# Patient Record
Sex: Female | Born: 2000 | Race: White | Hispanic: No | Marital: Single | Smoking: Former smoker
Health system: Southern US, Community
[De-identification: ages and names within clinical notes are randomized; demographics above are authoritative.]

## PROBLEM LIST (undated history)

## (undated) DIAGNOSIS — F32A Depression, unspecified: Secondary | ICD-10-CM

## (undated) DIAGNOSIS — F419 Anxiety disorder, unspecified: Secondary | ICD-10-CM

## (undated) DIAGNOSIS — N739 Female pelvic inflammatory disease, unspecified: Secondary | ICD-10-CM

## (undated) DIAGNOSIS — Z789 Other specified health status: Secondary | ICD-10-CM

## (undated) DIAGNOSIS — N390 Urinary tract infection, site not specified: Secondary | ICD-10-CM

## (undated) HISTORY — DX: Other specified health status: Z78.9

## (undated) HISTORY — PX: NO PAST SURGERIES: SHX2092

---
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
## 2019-02-28 IMAGING — US US PELVIS COMPLETE TRANSABD/TRANSVAG
1 series · 14 of 25 positions shown · non-contrast
Comparison: None

CLINICAL DATA: Pelvic pain and metrorrhagia for the past day.

EXAM:
TRANSABDOMINAL AND TRANSVAGINAL ULTRASOUND OF PELVIS
TECHNIQUE: Both transabdominal and transvaginal ultrasound examinations of the
pelvis were performed. Transabdominal technique was performed for
global imaging of the pelvis including uterus, ovaries, adnexal
regions, and pelvic cul-de-sac. It was necessary to proceed with
endovaginal exam following the transabdominal exam to visualize the
uterus, endometrium and ovaries in better detail.

[Series 1: us pelvis complete transabd/transvag · 0.21mm/px · 14 of 70 slices shown]
[im 1/70]
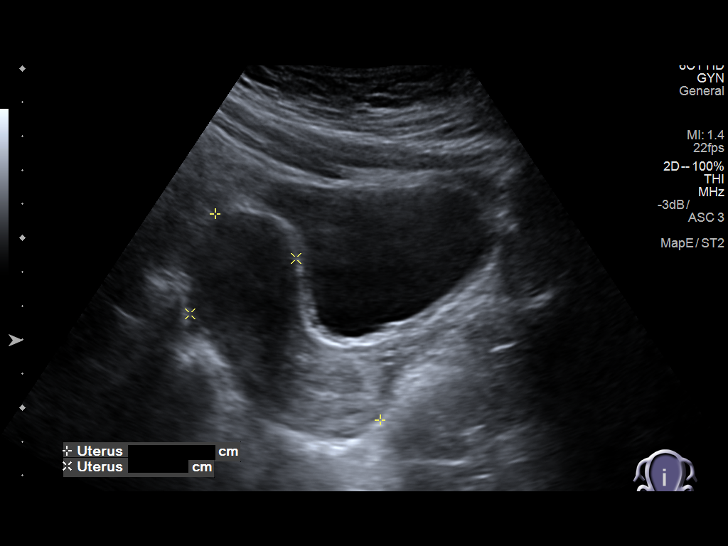
[im 6/70]
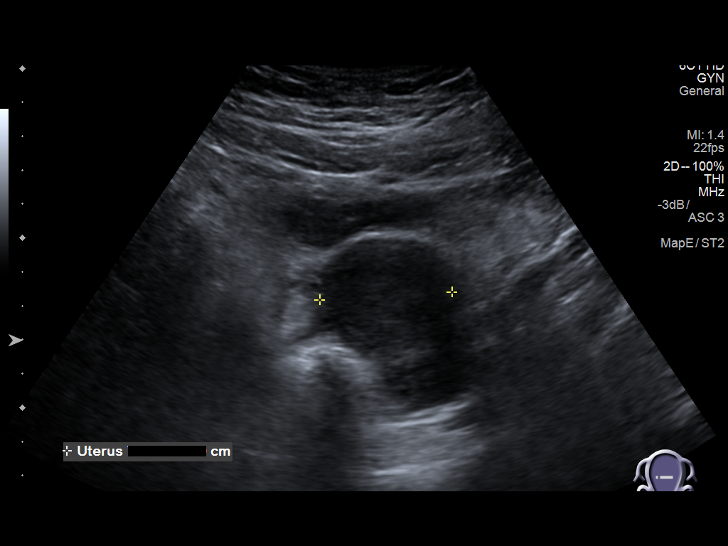
[im 12/70]
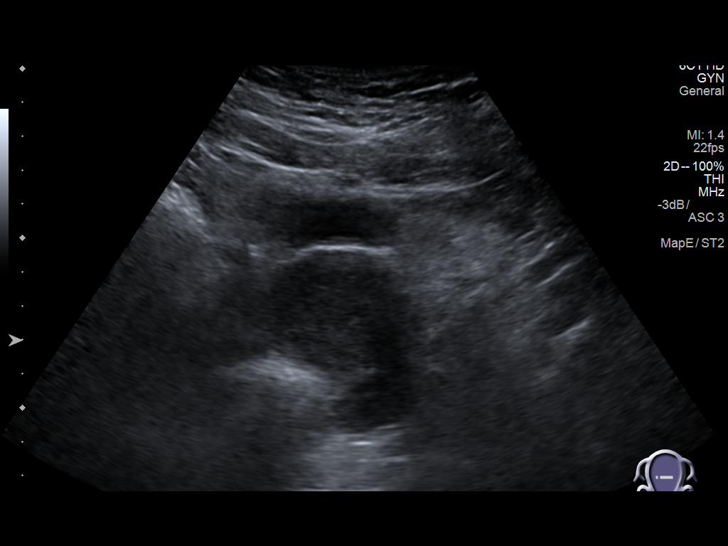
[im 18/70]
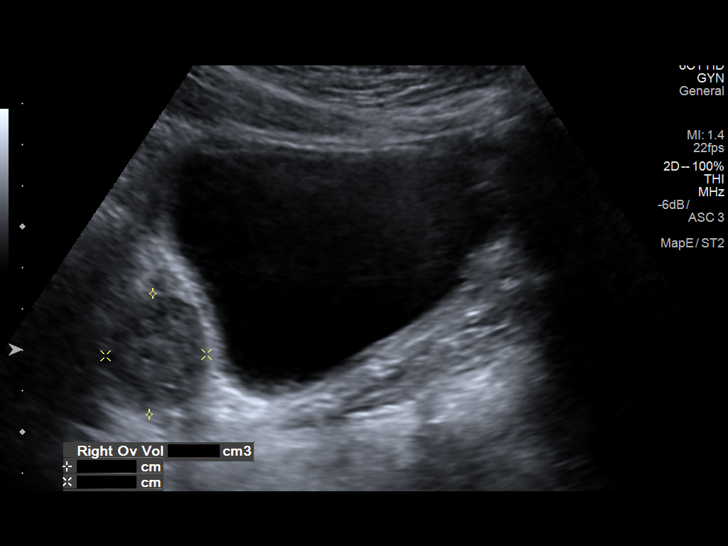
[im 24/70]
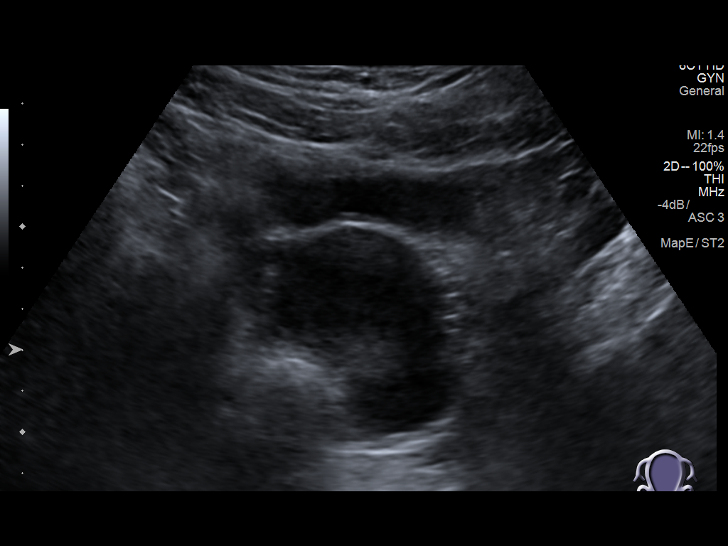
[im 26/70]
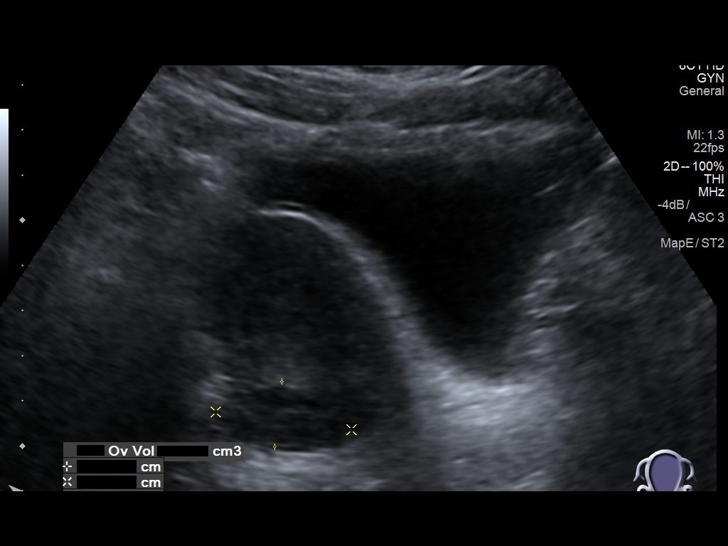
[im 32/70]
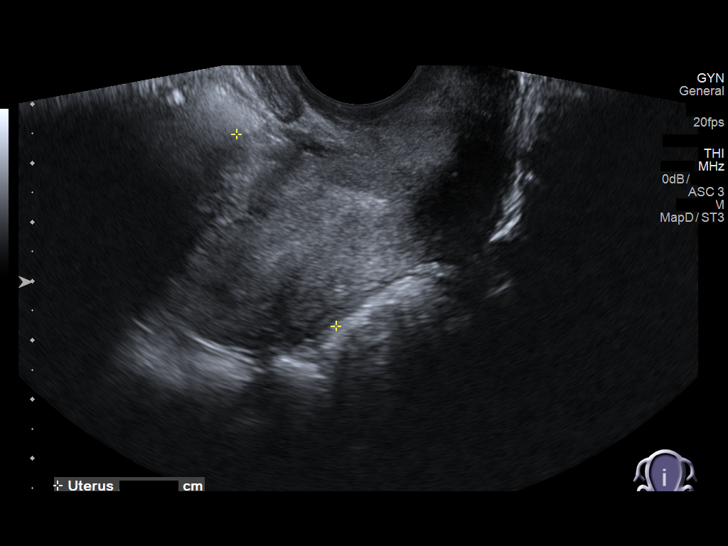
[im 38/70]
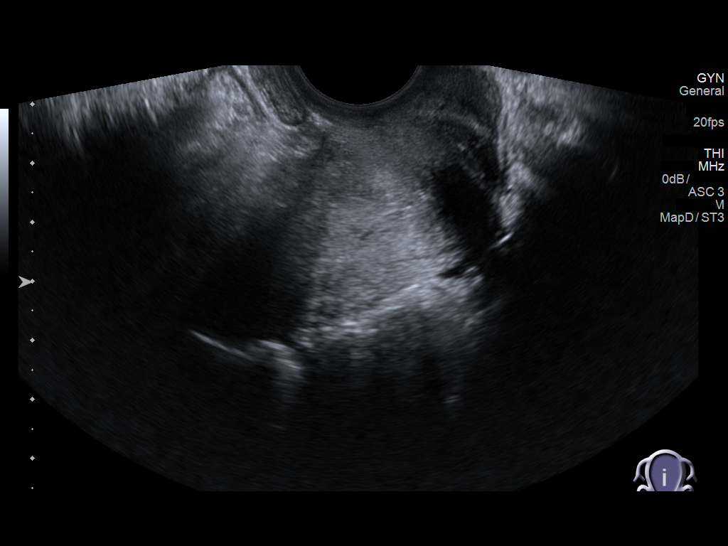
[im 44/70]
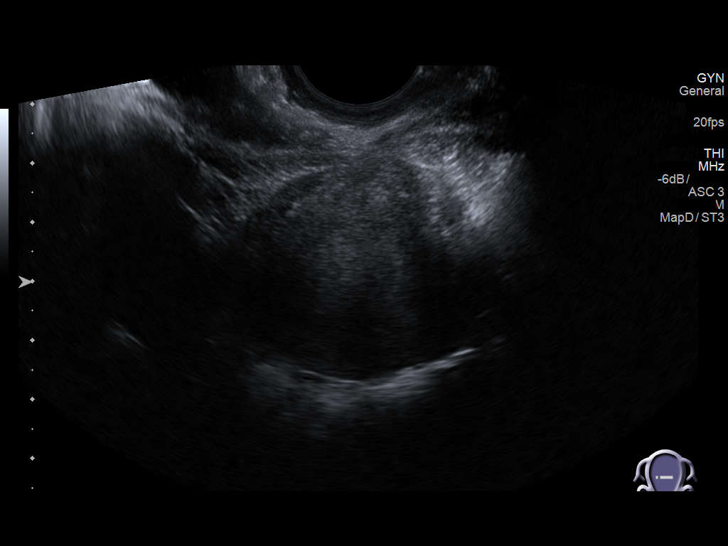
[im 47/70]
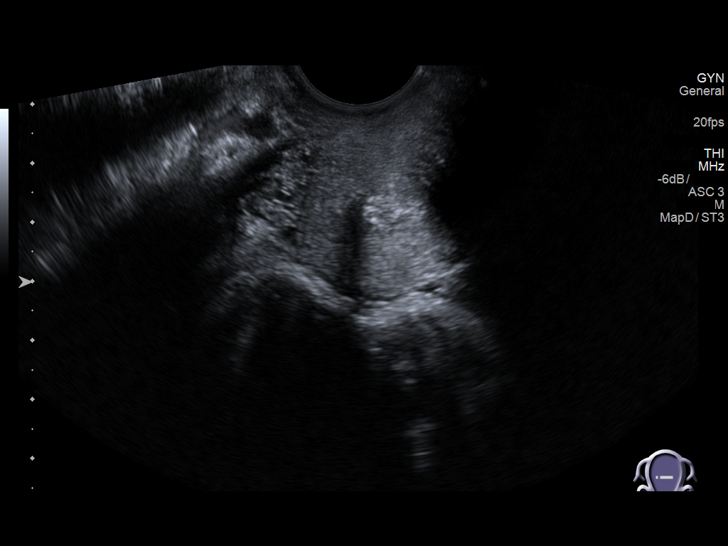
[im 52/70]
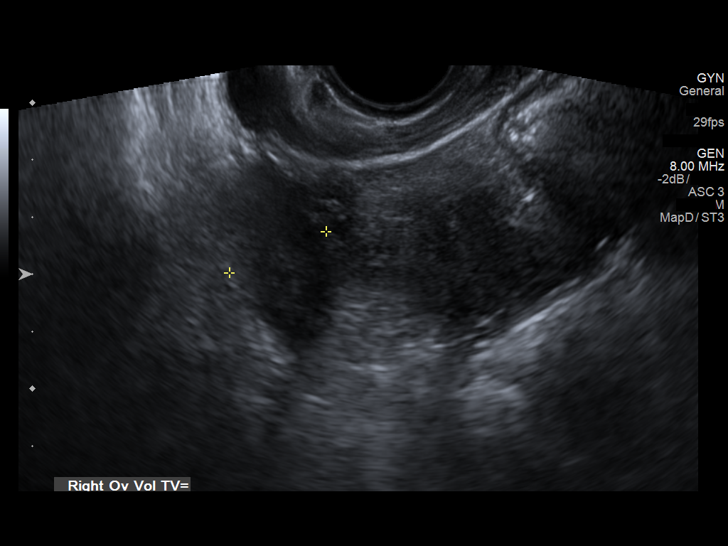
[im 58/70]
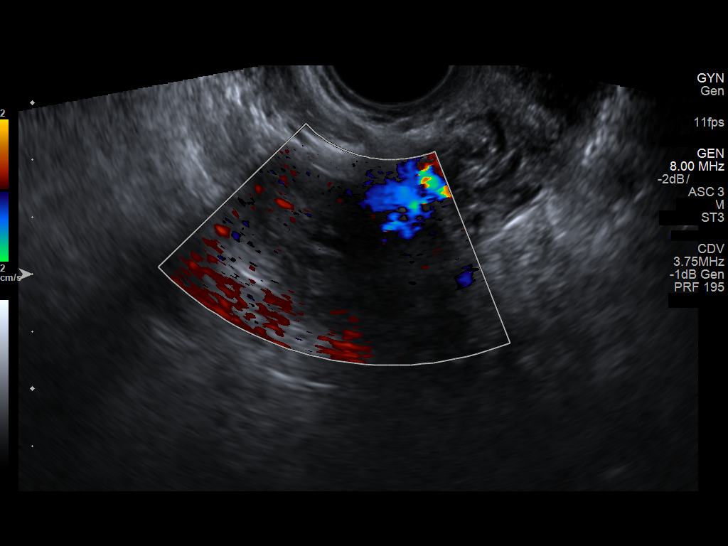
[im 64/70]
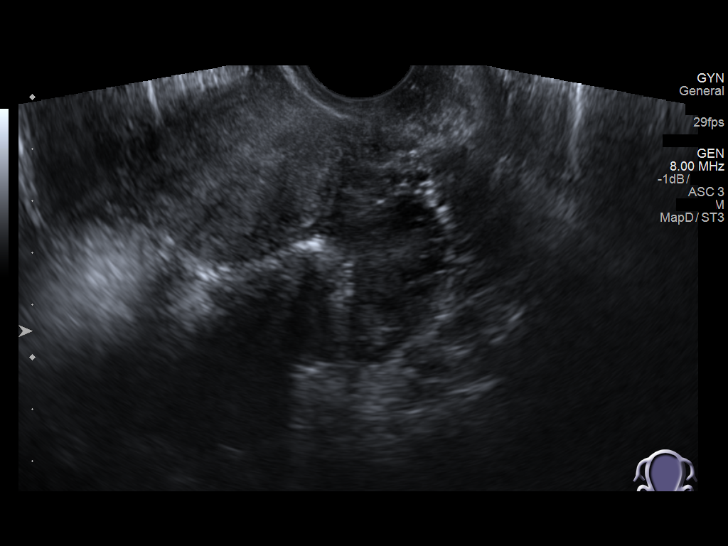
[im 70/70]
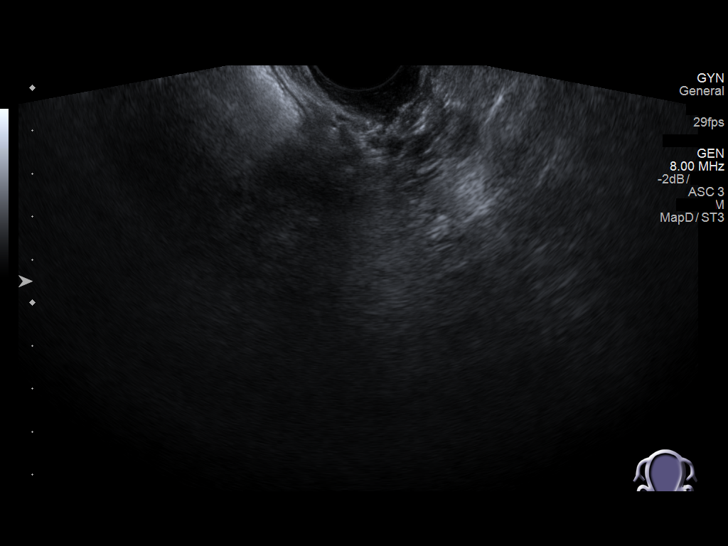

[14 of 25 positions shown; findings below may reference images not displayed]

FINDINGS: Uterus

Measurements: 7.5 x 3.8 x 3.6 cm. No fibroids or other mass
visualized.

Endometrium

Thickness: Poorly visualized, measuring between 5.1 and 9.6 mm in
maximum thickness. Poorly visualized and mildly heterogeneous.

Right ovary

Measurements: 2.9 x 2.5 x 1.7 cm. Normal appearance/no adnexal mass.

Left ovary

Measurements: 3.0 x 2.7 x 1.5 cm. Normal appearance/no adnexal mass.

Other findings

No abnormal free fluid.
IMPRESSION: Poorly visualized and mildly heterogeneous endometrium. Otherwise,
normal examination.

## 2019-03-18 ENCOUNTER — Telehealth: Payer: Self-pay | Admitting: Women's Health

## 2019-03-18 NOTE — Telephone Encounter (Signed)
Pt requesting an appt to discuss her menstrual cycles. She states they are very heavy and painful. Pt missed her appt in January. Willing to do webex if needed.

## 2019-03-18 NOTE — Telephone Encounter (Signed)
Left message for patient to call back to schedule webex. Told her to let front desk know that its ok.

## 2019-03-26 ENCOUNTER — Encounter: Payer: Self-pay | Admitting: Women's Health

## 2019-03-26 ENCOUNTER — Ambulatory Visit (INDEPENDENT_AMBULATORY_CARE_PROVIDER_SITE_OTHER): Payer: Medicaid Other | Admitting: Women's Health

## 2019-03-26 DIAGNOSIS — N921 Excessive and frequent menstruation with irregular cycle: Secondary | ICD-10-CM

## 2019-03-26 DIAGNOSIS — Z113 Encounter for screening for infections with a predominantly sexual mode of transmission: Secondary | ICD-10-CM

## 2019-03-26 MED ORDER — NORGESTIMATE-ETH ESTRADIOL 0.25-35 MG-MCG PO TABS: 1.0000 | ORAL_TABLET | Freq: Every day | ORAL | 3 refills | Status: DC

## 2019-03-26 NOTE — Progress Notes (Signed)
Kaitlyn [name] TEXTTAG>EXTTAG>eatra RobinsonBlanchard KelchFaythe [phone]  Kaitlyn [name] Kaitlyn RobinsonBlanchard KelchFaythe [phone]  [name] Kaitlyn [name]  visit- bp check, self-nuswab, cbc; then for f/u.  Cheral Marker CNM, [facility] 03/26/2019 10:09 AM

## 2019-03-28 ENCOUNTER — Other Ambulatory Visit: Payer: Self-pay

## 2019-03-28 ENCOUNTER — Ambulatory Visit (INDEPENDENT_AMBULATORY_CARE_PROVIDER_SITE_OTHER): Payer: Medicaid Other | Admitting: *Deleted

## 2019-03-28 ENCOUNTER — Encounter: Payer: Self-pay | Admitting: *Deleted

## 2019-03-28 VITALS — BP 111/72 | HR 87

## 2019-03-28 DIAGNOSIS — Z0130 Encounter for examination of blood pressure without abnormal findings: Secondary | ICD-10-CM

## 2019-03-29 LAB — CBC
Hematocrit: 34.9 % (ref 34.0–46.6)
Hemoglobin: 11.6 g/dL (ref 11.1–15.9)
MCH: 26.9 pg (ref 26.6–33.0)
MCHC: 33.2 g/dL (ref 31.5–35.7)
MCV: 81 fL (ref 79–97)
Platelets: 288 10*3/uL (ref 150–450)
RBC: 4.31 x10E6/uL (ref 3.77–5.28)
RDW: 14.7 % (ref 11.7–15.4)
WBC: 7 10*3/uL (ref 3.4–10.8)

## 2019-04-06 LAB — NUSWAB VAGINITIS PLUS (VG+)
Candida albicans, NAA: NEGATIVE
Candida glabrata, NAA: NEGATIVE
Chlamydia trachomatis, NAA: NEGATIVE
Megasphaera 1: HIGH Score — AB
Neisseria gonorrhoeae, NAA: NEGATIVE
Trich vag by NAA: NEGATIVE

## 2019-04-07 ENCOUNTER — Other Ambulatory Visit: Payer: Self-pay | Admitting: Women's Health

## 2019-04-07 ENCOUNTER — Telehealth: Payer: Self-pay | Admitting: *Deleted

## 2019-04-07 MED ORDER — METRONIDAZOLE 500 MG PO TABS: 500.0000 mg | ORAL_TABLET | Freq: Two times a day (BID) | ORAL | 0 refills | Status: DC

## 2019-04-07 NOTE — Telephone Encounter (Signed)
Left message @ 2:33 pm. JSY

## 2019-04-07 NOTE — Telephone Encounter (Signed)
-----   Message from Roma Schanz, [place] sent at 04/07/2019  2:18 PM EDT ----- Will you let her know swab came back + BV, I have rx'd metronidazole. Thanks

## 2019-04-08 NOTE — Telephone Encounter (Signed)
Pt aware swab was + for BV. Flagyl was sent to pharmacy. Pt voiced understanding. [place]

## 2019-04-08 NOTE — Telephone Encounter (Signed)
Left message @ 9:20 am. JSY

## 2019-04-08 NOTE — Telephone Encounter (Signed)
-----   Message from Kimberly R Booker, CNM sent at 04/07/2019  2:18 PM EDT ----- Will you let her know swab came back + BV, I have rx'd metronidazole. Thanks 

## 2019-04-24 ENCOUNTER — Telehealth: Payer: Self-pay | Admitting: Adult Health

## 2019-04-24 MED ORDER — LO LOESTRIN FE 1 MG-10 MCG / 10 MCG PO TABS: 1.0000 | ORAL_TABLET | Freq: Every day | ORAL | 11 refills | Status: DC

## 2019-04-24 NOTE — Addendum Note (Signed)
Addended by: Derrek Monaco A on: 04/24/2019 04:41 PM   Modules accepted: Orders

## 2019-04-24 NOTE — Telephone Encounter (Addendum)
Pt stopped birth control due to it was causing vomiting. Pt was taking it with food and tried taking it at bedtime. Pt was only on med x 1 week before she stopped it. Pt wants to switch to something else. Please advise. Thanks!! [place]

## 2019-04-24 NOTE — Telephone Encounter (Signed)
Patient called, stated that her birth control was making her sick so she stopped taking it about 2-2.5 weeks ago.  Wants to know if you want to change it.  Isac Caddy  [phone]

## 2019-04-24 NOTE — Telephone Encounter (Signed)
Pt stopped sprintec,made her feel bad, but wants something else will rx lo loestrin, to start when starts next period and use condoms and take with food.

## 2019-05-01 ENCOUNTER — Other Ambulatory Visit: Payer: Self-pay

## 2019-05-01 ENCOUNTER — Ambulatory Visit: Payer: Medicaid Other

## 2019-05-01 ENCOUNTER — Ambulatory Visit (LOCAL_COMMUNITY_HEALTH_CENTER): Payer: Self-pay

## 2019-05-01 DIAGNOSIS — Z719 Counseling, unspecified: Secondary | ICD-10-CM

## 2019-05-01 NOTE — Progress Notes (Signed)
Patient here for Hep A vaccine. Patient provided [employer] form but form was not completed by supevisor.  Explained that form must be completed by employer before admin of Hep A. Provided RN contact info in case patient is able to have form completed and would like to RTC today. Patient verbalized understanding  Aileen Fass, RN

## 2019-06-02 ENCOUNTER — Ambulatory Visit: Payer: Medicaid Other

## 2019-06-02 ENCOUNTER — Other Ambulatory Visit: Payer: Self-pay

## 2019-06-02 ENCOUNTER — Ambulatory Visit
Admission: EM | Admit: 2019-06-02 | Discharge: 2019-06-02 | Disposition: A | Discharge status: Home / Self Care | Payer: Medicaid Other | Attending: Internal Medicine | Admitting: Internal Medicine

## 2019-06-02 ENCOUNTER — Encounter: Payer: Self-pay | Admitting: Emergency Medicine

## 2019-06-02 DIAGNOSIS — R197 Diarrhea, unspecified: Secondary | ICD-10-CM | POA: Diagnosis not present

## 2019-06-02 DIAGNOSIS — Z20828 Contact with and (suspected) exposure to other viral communicable diseases: Secondary | ICD-10-CM | POA: Insufficient documentation

## 2019-06-02 DIAGNOSIS — J029 Acute pharyngitis, unspecified: Secondary | ICD-10-CM | POA: Insufficient documentation

## 2019-06-02 LAB — PREGNANCY, URINE: Preg Test, Ur: NEGATIVE

## 2019-06-02 LAB — FIBRIN DERIVATIVES D-DIMER (ARMC ONLY): Fibrin derivatives D-dimer (ARMC): 250.03 ng/mL (FEU) (ref 0.00–499.00)

## 2019-06-02 LAB — RAPID STREP SCREEN (MED CTR MEBANE ONLY): Streptococcus, Group A Screen (Direct): NEGATIVE

## 2019-06-02 NOTE — Discharge Instructions (Addendum)
You may take Pepto for diarrhea and avoid any dairy. Give it a couple of more days, since things are slowing down. If you get a fever and worse diarrhea, you will need stool studies done.  BEST to eat soup broth, any type of carbs and bananas.  All your test today are normal.  We are sending a throat culture and we will call you if it becomes positive.

## 2019-06-02 NOTE — ED Triage Notes (Signed)
Pt reports diarrhea, body aches, sore throat x5 days. Denies exposure to COVID.

## 2019-06-02 NOTE — ED Provider Notes (Addendum)
[facility]    CSN: [id number] Arrival date & time: 06/02/19  1239      History   Chief Complaint Chief Complaint  Patient presents with   Diarrhea   Sore Throat    HPI Kaitlyn Meyer is a 18 y.o. female. who presents with body aches which started 5 days ago, and 3 days ago developed a ST. She had mild chills and sweats,but did not check her temp.  Has felt SOB, denies coughing. Denies loss of smell.  Onset of diarrhea x 5 days with an average of 10BMs per day. Went x 2 this am. It is getting less as the days go by. Denies blood in the stool. Denies being on any antibiotics in the past month. She ate a sandwich before this started, but was freshly made and other people she was with ate the same thing. She works at a [employer] store. Denies exposure to covid.  Denies taking anything for diarrhea,but did take Tylenol and nyquil.  Denies long car rides or flights in the past month.  Has been is on a new birth control x 1 month, but had been on birth control 3 months ago, and was without for 2 months before this last one.      History reviewed. No pertinent past medical history.  There are no active problems to display for this patient.   No past surgical history on file.  OB History    Gravida  0   Para  0   Term  0   Preterm  0   AB  0   Living  0     SAB  0   TAB  0   Ectopic  0   Multiple  0   Live Births  0            Home Medications    Prior to Admission medications   Medication Sig Start Date End Date Taking? Authorizing Provider  metroNIDAZOLE (FLAGYL) 500 MG tablet Take 1 tablet (500 mg total) by mouth 2 (two) times daily. 04/07/19   Cheral MarkerBooker, Kimberly R, CNM  Norethindrone-Ethinyl Estradiol-Fe Biphas (LO LOESTRIN FE) 1 MG-10 MCG / 10 MCG tablet Take 1 tablet by mouth daily. 04/24/19   Adline PotterGriffin, Jennifer A, NP    Family History No family history on file.  Social History Social History   Tobacco Use   Smoking status: Passive  Smoke Exposure - Never Smoker   Smokeless tobacco: Never Used  Substance Use Topics   Alcohol use: No   Drug use: No     Allergies   Patient has no known allergies.   Review of Systems Review of Systems  Constitutional: Positive for appetite change, chills, diaphoresis and fatigue.  HENT: Positive for sore throat. Negative for congestion and postnasal drip.   Respiratory: Positive for shortness of breath. Negative for cough.   Musculoskeletal:       Denies leg swelling or calf pain.      Physical Exam Triage Vital Signs ED Triage Vitals [06/02/19 1253]  Enc Vitals Group     BP 123/79     Pulse Rate 76     Resp 13     Temp 98.4 F (36.9 C)     Temp Source Oral     SpO2 98 %     Weight 145 lb (65.8 kg)     Height 5\' 1"  (1.549 m)     Head Circumference      Peak Flow  Pain Score 4     Pain Loc      Pain Edu?      Excl. in [place]?    No data found.  Updated Vital Signs BP 123/79    Pulse 76    Temp 98.4 F (36.9 C) (Oral)    Resp 13    Ht 5\' 1"  (1.549 m)    Wt 145 lb (65.8 kg)    LMP 05/26/2019    SpO2 98%    BMI 27.40 kg/m   Visual Acuity Right Eye Distance:   Left Eye Distance:   Bilateral Distance:    Right Eye Near:   Left Eye Near:    Bilateral Near:     Physical Exam Vitals signs and nursing note reviewed.  Constitutional:      General: She is not in acute distress.    Appearance: She is not ill-appearing, toxic-appearing or diaphoretic.  HENT:     Head: Normocephalic.     Right Ear: Tympanic membrane and ear canal normal. No drainage or swelling.     Left Ear: Tympanic membrane and ear canal normal. No drainage or swelling.     Mouth/Throat:     Mouth: Mucous membranes are moist. No oral lesions.     Pharynx: Oropharynx is clear. Posterior oropharyngeal erythema present. No pharyngeal swelling, oropharyngeal exudate or uvula swelling.     Comments: Has very mild erythema of pharynx and tonsils.  Eyes:     Conjunctiva/sclera: Conjunctivae  normal.  Neck:     Musculoskeletal: Neck supple.  Cardiovascular:     Rate and Rhythm: Normal rate and regular rhythm.     Heart sounds: No murmur.     Comments: Negative calf tenderness and hoffman's is negative.  Pulmonary:     Effort: Pulmonary effort is normal. No respiratory distress.     Breath sounds: Normal breath sounds. No wheezing, rhonchi or rales.  Abdominal:     General: Bowel sounds are normal. There is no distension.     Palpations: Abdomen is soft. There is no mass.     Tenderness: There is abdominal tenderness. There is no guarding or rebound.     Comments: Has mild tenderness throughout   Skin:    General: Skin is warm.     Findings: No rash.  Neurological:     Mental Status: She is alert and oriented to person, place, and time.  Psychiatric:        Mood and Affect: Mood normal.        Behavior: Behavior normal.    UC Treatments / Results  Labs (all labs ordered are listed, but only abnormal results are displayed) Labs Reviewed  RAPID STREP SCREEN ([facility] ONLY)  NOVEL CORONAVIRUS, NAA (HOSPITAL ORDER, SEND-OUT TO REF LAB)  CULTURE, GROUP A STREP [facility])    EKG   Radiology No results found.  Procedures  Medications Ordered in UC Medications - No data to display  Initial Impression / Assessment and Plan / UC Course  I have reviewed the triage vital signs and the nursing notes. Pertinent labs & imaging results that were available during my care of the patient were reviewed by me and considered in my medical decision making (see chart for details). D dimer and CXR neg See instructions   Final Clinical Impressions(s) / UC Diagnoses   Final diagnoses:  None   Discharge Instructions   None    ED Prescriptions    None     Controlled Substance  Prescriptions [place] Controlled Substance Registry consulted?    Garey HamRodriguez-Southworth, Nyilah Kight, PA-C 06/02/19 1451    Rodriguez-Southworth, [place] Lake[place], PA-C 06/02/19 1452

## 2019-06-03 ENCOUNTER — Ambulatory Visit: Payer: Medicaid Other | Admitting: Family Medicine

## 2019-06-03 LAB — NOVEL CORONAVIRUS, NAA (HOSP ORDER, SEND-OUT TO REF LAB; TAT 18-24 HRS): SARS-CoV-2, NAA: NOT DETECTED

## 2019-06-05 LAB — CULTURE, GROUP A STREP (THRC)

## 2019-06-24 ENCOUNTER — Ambulatory Visit: Payer: Medicaid Other | Admitting: Women's Health

## 2019-09-12 ENCOUNTER — Ambulatory Visit
Admission: EM | Admit: 2019-09-12 | Discharge: 2019-09-12 | Disposition: A | Discharge status: Home / Self Care | Payer: Medicaid Other | Attending: Emergency Medicine | Admitting: Emergency Medicine

## 2019-09-12 ENCOUNTER — Other Ambulatory Visit: Payer: Self-pay

## 2019-09-12 ENCOUNTER — Encounter: Payer: Self-pay | Admitting: Emergency Medicine

## 2019-09-12 DIAGNOSIS — U071 COVID-19: Secondary | ICD-10-CM | POA: Diagnosis not present

## 2019-09-12 DIAGNOSIS — R519 Headache, unspecified: Secondary | ICD-10-CM | POA: Diagnosis not present

## 2019-09-12 DIAGNOSIS — Z20822 Contact with and (suspected) exposure to covid-19: Secondary | ICD-10-CM

## 2019-09-12 DIAGNOSIS — M791 Myalgia, unspecified site: Secondary | ICD-10-CM

## 2019-09-12 DIAGNOSIS — Z20828 Contact with and (suspected) exposure to other viral communicable diseases: Secondary | ICD-10-CM | POA: Diagnosis present

## 2019-09-12 DIAGNOSIS — R0981 Nasal congestion: Secondary | ICD-10-CM

## 2019-09-12 DIAGNOSIS — R0602 Shortness of breath: Secondary | ICD-10-CM | POA: Diagnosis not present

## 2019-09-12 LAB — RAPID STREP SCREEN (MED CTR MEBANE ONLY): Streptococcus, Group A Screen (Direct): NEGATIVE

## 2019-09-12 MED ORDER — FLUTICASONE PROPIONATE 50 MCG/ACT NA SUSP: 2.0000 | Freq: Every day | NASAL | 0 refills | Status: DC

## 2019-09-12 MED ORDER — IBUPROFEN 600 MG PO TABS: 600.0000 mg | ORAL_TABLET | Freq: Four times a day (QID) | ORAL | 0 refills | Status: DC | PRN

## 2019-09-12 NOTE — ED Provider Notes (Signed)
HPI  SUBJECTIVE:  Kaitlyn Meyer is a 18 y.o. female who presents with body aches, headaches, nasal congestion, rhinorrhea, sore throat, shortness of breath, nausea after having a close, prolonged exposure to a friend who recently tested positive for Covid.  Symptoms started yesterday.  She states that she is unable to sleep secondary to body aches.  She denies fevers, coughing, wheezing, abdominal pain, vomiting, diarrhea.  No exposure to flu.  She took an over-the-counter cold medication containing antipyretic within 4 to 6 hours of evaluation.  She tried NyQuil which helps her sleep.  No aggravating factors.  Past medical history negative for asthma, diabetes, hypertension, chronic kidney disease, HIV, immunocompromise.  She did not get a flu shot this year.  LMP: 4 weeks ago.  Denies the possibility of being pregnant.  WUJ:WJXBJY, Grace Bushy, MD   History reviewed. No pertinent past medical history.  History reviewed. No pertinent surgical history.  Family History  Family history unknown: Yes    Social History   Tobacco Use  . Smoking status: Former Research scientist (life sciences)  . Smokeless tobacco: Never Used  Substance Use Topics  . Alcohol use: No  . Drug use: No    No current facility-administered medications for this encounter.   Current Outpatient Medications:  .  Norethindrone-Ethinyl Estradiol-Fe Biphas (LO LOESTRIN FE) 1 MG-10 MCG / 10 MCG tablet, Take 1 tablet by mouth daily., Disp: 1 Package, Rfl: 11 .  sertraline (ZOLOFT) 100 MG tablet, Take 100 mg by mouth daily., Disp: , Rfl:  .  traZODone (DESYREL) 50 MG tablet, TAKE 1 2 TABLET 2 TIMES DAILY AS NEEDED FOR ANXIETY AND 1 2 BY MOUTH AT BEDTIME FOR INSOMNIA AS NEEDED, Disp: , Rfl:  .  fluticasone (FLONASE) 50 MCG/ACT nasal spray, Place 2 sprays into both nostrils daily., Disp: 16 g, Rfl: 0 .  ibuprofen (ADVIL) 600 MG tablet, Take 1 tablet (600 mg total) by mouth every 6 (six) hours as needed., Disp: 30 tablet, Rfl: 0  No Known  Allergies   ROS  As noted in HPI.   Physical Exam  BP 120/78 (BP Location: Left Arm)   Pulse 97   Temp 99.3 F (37.4 C) (Oral)   Resp 14   Ht 5\' 1"  (1.549 m)   Wt 68 kg   LMP 08/15/2019 (Approximate)   SpO2 100%   BMI 28.34 kg/m   Constitutional: Well developed, well nourished, no acute distress Eyes:  EOMI, conjunctiva normal bilaterally HENT: Normocephalic, atraumatic,mucus membranes moist.  Clear rhinorrhea.  Erythematous, swollen turbinates.  Normal tonsils without exudates.  Uvula midline.  No obvious postnasal drip. Neck: No anterior, posterior cervical lymphadenopathy Respiratory: Normal inspiratory effort, lungs clear bilaterally Cardiovascular: Normal rate regular rhythm no murmurs rubs or gallops GI: nondistended skin: No rash, skin intact Musculoskeletal: no deformities Neurologic: Alert & oriented x 3, no focal neuro deficits Psychiatric: Speech and behavior appropriate   ED Course   Medications - No data to display  Orders Placed This Encounter  Procedures  . Rapid Strep Screen ([facility] ONLY)    Standing Status:   Standing    Number of Occurrences:   1  . Novel Coronavirus, NAA (Hosp order, Send-out to Ref Lab; TAT 18-24 hrs    Standing Status:   Standing    Number of Occurrences:   1    Order Specific Question:   Is this test for diagnosis or screening    Answer:   Screening    Order Specific Question:  Symptomatic for COVID-19 as defined by CDC    Answer:   Yes    Order Specific Question:   Date of Symptom Onset    Answer:   09/11/2019    Order Specific Question:   Hospitalized for COVID-19    Answer:   No    Order Specific Question:   Admitted to ICU for COVID-19    Answer:   No    Order Specific Question:   Previously tested for COVID-19    Answer:   No    Order Specific Question:   Resident in a congregate (group) care setting    Answer:   No    Order Specific Question:   Employed in healthcare setting    Answer:   No    Order  Specific Question:   Pregnant    Answer:   No  . Culture, group A strep    Standing Status:   Standing    Number of Occurrences:   1    Results for orders placed or performed during the hospital encounter of 09/12/19 (from the past 24 hour(s))  Rapid Strep Screen ([facility] ONLY)     Status: None   Collection Time: 09/12/19 12:24 PM   Specimen: Throat; Other  Result Value Ref Range   Streptococcus, Group A Screen (Direct) NEGATIVE NEGATIVE   No results found.  ED Clinical Impression  1. Suspected COVID-19 virus infection      ED Assessment/Plan  Rapid strep negative.  Suspect Covid infection given close exposure.  Home with supportive treatment including Tylenol/ibuprofen, Flonase, saline nasal irrigation, Mucinex D.  Advised strict self isolation.   Discussed labs,  MDM, treatment plan, and plan for follow-up with patient. Discussed sn/sx that should prompt return to the ED. patient agrees with plan.   Meds ordered this encounter  Medications  . fluticasone (FLONASE) 50 MCG/ACT nasal spray    Sig: Place 2 sprays into both nostrils daily.    Dispense:  16 g    Refill:  0  . ibuprofen (ADVIL) 600 MG tablet    Sig: Take 1 tablet (600 mg total) by mouth every 6 (six) hours as needed.    Dispense:  30 tablet    Refill:  0    *This clinic note was created using Scientist, clinical (histocompatibility and immunogenetics). Therefore, there may be occasional mistakes despite careful proofreading.   ?    Domenick Gong, MD 09/12/19 [phone]

## 2019-09-12 NOTE — ED Triage Notes (Signed)
Patient c/o sore throat, cough, congestion, HAs, and bodyaches that started yesterday.  Patient denies fever.

## 2019-09-12 NOTE — Discharge Instructions (Signed)
Take the medication as written. Take 1 gram of tylenol with the 600 mg motrin up to 3-4 times a day as needed for pain and fever. This is an effective combination. Drink extra fluids. Start taking  mucinex-D to keep the mucus secretions thin. Use a NeilMed sinus rinse with distilled water as often as you want to to reduce nasal congestion. Follow the directions on the box.  Stop all other cold medications.  Go to www.goodrx.com to look up your medications. This will give you a list of where you can find your prescriptions at the most affordable prices. Or ask the pharmacist what the cash price is, or if they have any other discount programs available to help make your medication more affordable. This can be less expensive than what you would pay with insurance.

## 2019-09-14 LAB — NOVEL CORONAVIRUS, NAA (HOSP ORDER, SEND-OUT TO REF LAB; TAT 18-24 HRS): SARS-CoV-2, NAA: DETECTED — AB

## 2019-09-15 ENCOUNTER — Telehealth: Payer: Self-pay | Admitting: Emergency Medicine

## 2019-09-15 LAB — CULTURE, GROUP A STREP (THRC)

## 2019-09-15 NOTE — Telephone Encounter (Signed)
Positive Covid. Pt contacted, states someone called her already and let her know. She had no questions.

## 2019-11-26 ENCOUNTER — Encounter: Payer: Self-pay | Admitting: Family Medicine

## 2019-11-27 ENCOUNTER — Encounter: Payer: Self-pay | Admitting: Family Medicine

## 2020-01-03 ENCOUNTER — Ambulatory Visit
Admission: EM | Admit: 2020-01-03 | Discharge: 2020-01-03 | Disposition: A | Discharge status: Home / Self Care | Payer: Medicaid Other | Attending: Family Medicine | Admitting: Family Medicine

## 2020-01-03 ENCOUNTER — Other Ambulatory Visit: Payer: Self-pay

## 2020-01-03 ENCOUNTER — Encounter: Payer: Self-pay | Admitting: Emergency Medicine

## 2020-01-03 DIAGNOSIS — Z79899 Other long term (current) drug therapy: Secondary | ICD-10-CM | POA: Diagnosis not present

## 2020-01-03 DIAGNOSIS — R439 Unspecified disturbances of smell and taste: Secondary | ICD-10-CM | POA: Insufficient documentation

## 2020-01-03 DIAGNOSIS — R079 Chest pain, unspecified: Secondary | ICD-10-CM | POA: Diagnosis not present

## 2020-01-03 DIAGNOSIS — R0602 Shortness of breath: Secondary | ICD-10-CM | POA: Diagnosis not present

## 2020-01-03 DIAGNOSIS — Z8616 Personal history of COVID-19: Secondary | ICD-10-CM | POA: Insufficient documentation

## 2020-01-03 DIAGNOSIS — Z20822 Contact with and (suspected) exposure to covid-19: Secondary | ICD-10-CM | POA: Insufficient documentation

## 2020-01-03 DIAGNOSIS — R6889 Other general symptoms and signs: Secondary | ICD-10-CM | POA: Diagnosis not present

## 2020-01-03 DIAGNOSIS — R197 Diarrhea, unspecified: Secondary | ICD-10-CM

## 2020-01-03 DIAGNOSIS — Z87891 Personal history of nicotine dependence: Secondary | ICD-10-CM | POA: Diagnosis not present

## 2020-01-03 NOTE — ED Provider Notes (Signed)
[facility]    CSN: [id number] Arrival date & time: 01/03/20  1207      History   Chief Complaint Chief Complaint  Patient presents with  . loss of taste   HPI  19 year old female presents with multiple complaints.  Patient tested positive for COVID-19 on 11/13.  Patient states that she recovered and did well following her diagnosis.  Patient states that she has not been feeling well since February.  She reports ongoing diffuse body aches, diarrhea, shortness of breath, chest pain.  She states that over the past 1 to 2 weeks she has noticed a change in her taste and smell.  She feels like she cannot smell anything and cannot taste anything.  She states that everything tastes the same.  She rates her pain as 2/10 in severity.  She is not currently taking any medications.  Patient states that she her symptoms are essentially what she had when she was diagnosed with Covid and November.  She is concerned that she has an additional case of Covid.  No other reported symptoms.  No other complaints at this time.  Past Surgical History:  Procedure Laterality Date  . NO PAST SURGERIES      OB History    Gravida  0   Para  0   Term  0   Preterm  0   AB  0   Living  0     SAB  0   TAB  0   Ectopic  0   Multiple  0   Live Births  0            Home Medications    Prior to Admission medications   Medication Sig Start Date End Date Taking? Authorizing Provider  sertraline (ZOLOFT) 100 MG tablet Take 100 mg by mouth daily. 09/09/19   [provider]  fluticasone (FLONASE) 50 MCG/ACT nasal spray Place 2 sprays into both nostrils daily. 09/12/19 01/03/20  Domenick Gong, MD  Norethindrone-Ethinyl Estradiol-Fe Biphas (LO LOESTRIN FE) 1 MG-10 MCG / 10 MCG tablet Take 1 tablet by mouth daily. 04/24/19 01/03/20  Adline Potter, NP  traZODone (DESYREL) 50 MG tablet TAKE 1 2 TABLET 2 TIMES DAILY AS NEEDED FOR ANXIETY AND 1 2 BY MOUTH AT BEDTIME FOR  INSOMNIA AS NEEDED 09/09/19 01/03/20  [provider]    Family History Family History  Problem Relation Age of Onset  . Healthy Mother   . Healthy Father     Social History Social History   Tobacco Use  . Smoking status: Former Games developer  . Smokeless tobacco: Never Used  Substance Use Topics  . Alcohol use: No  . Drug use: No     Allergies   Patient has no known allergies.   Review of Systems Review of Systems  HENT:       Change in taste and smell.  Respiratory: Positive for shortness of breath.   Gastrointestinal: Positive for diarrhea.  Musculoskeletal:       Body aches.   Physical Exam Triage Vital Signs ED Triage Vitals  Enc Vitals Group     BP 01/03/20 1231 125/74     Pulse Rate 01/03/20 1231 83     Resp 01/03/20 1231 18     Temp 01/03/20 1231 98.6 F (37 C)     Temp Source 01/03/20 1231 Oral     SpO2 01/03/20 1231 99 %     Weight 01/03/20 1226 160 lb (72.6 kg)  Height 01/03/20 1226 5\' 1"  (1.549 m)     Head Circumference --      Peak Flow --      Pain Score 01/03/20 1226 2     Pain Loc --      Pain Edu? --      Excl. in [place]? --    Updated Vital Signs BP 125/74 (BP Location: Left Arm)   Pulse 83   Temp 98.6 F (37 C) (Oral)   Resp 18   Ht 5\' 1"  (1.549 m)   Wt 72.6 kg   LMP 12/01/2019 (Approximate)   SpO2 99%   BMI 30.23 kg/m   Visual Acuity Right Eye Distance:   Left Eye Distance:   Bilateral Distance:    Right Eye Near:   Left Eye Near:    Bilateral Near:     Physical Exam Vitals and nursing note reviewed.  Constitutional:      General: She is not in acute distress.    Appearance: Normal appearance. She is not ill-appearing.  HENT:     Head: Normocephalic and atraumatic.  Eyes:     General:        Right eye: No discharge.        Left eye: No discharge.     Conjunctiva/sclera: Conjunctivae normal.  Cardiovascular:     Rate and Rhythm: Normal rate and regular rhythm.     Heart sounds: No murmur.  Pulmonary:      Effort: Pulmonary effort is normal.     Breath sounds: Normal breath sounds. No wheezing, rhonchi or rales.  Neurological:     Mental Status: She is alert.  Psychiatric:     Comments: Flat affect. Depressed mood.    UC Treatments / Results  Labs (all labs ordered are listed, but only abnormal results are displayed) Labs Reviewed  SARS CORONAVIRUS 2 (TAT 6-24 HRS)    EKG   Radiology No results found.  Procedures Procedures (including critical care time)  Medications Ordered in UC Medications - No data to display  Initial Impression / Assessment and Plan / UC Course  I have reviewed the triage vital signs and the nursing notes.  Pertinent labs & imaging results that were available during my care of the patient were reviewed by me and considered in my medical decision making (see chart for details).    19 year old female presents with multiple somatic complaints.  Covid testing done today.  Etiology uncertain.  I personally do not feel that this is secondary to COVID-19.  Advised close follow-up with primary care physician.  Supportive care.   Final Clinical Impressions(s) / UC Diagnoses   Final diagnoses:  Multiple somatic complaints     Discharge Instructions     COVID test will be available in 24-48 hours.  Please schedule an appt with your PCP following the results of the COVID test.  Take care  Dr. Lacinda Axon    ED Prescriptions    None     PDMP not reviewed this encounter.   Coral Spikes, [place] 01/03/20 1321

## 2020-01-03 NOTE — Discharge Instructions (Signed)
COVID test will be available in 24-48 hours.  Please schedule an appt with your PCP following the results of the COVID test.  Take care  Dr. Adriana Simas

## 2020-01-03 NOTE — ED Triage Notes (Signed)
Pt states that about a week or 2 ago she started to notice that her taste had changed then she could not taste. She had covid back in november but she regained her taste for about a month. She also states that she has had some diarrhea, body aches, shortness of breath and mild chest pain.

## 2020-01-04 LAB — SARS CORONAVIRUS 2 (TAT 6-24 HRS): SARS Coronavirus 2: NEGATIVE

## 2020-01-19 ENCOUNTER — Encounter: Payer: Medicaid Other | Admitting: Family Medicine

## 2020-01-19 ENCOUNTER — Encounter: Payer: Self-pay | Admitting: Family Medicine

## 2020-02-06 ENCOUNTER — Encounter: Payer: Medicaid Other | Admitting: Nurse Practitioner

## 2020-02-09 ENCOUNTER — Encounter: Payer: Medicaid Other | Admitting: Family Medicine

## 2020-02-11 ENCOUNTER — Encounter: Payer: Self-pay | Admitting: Family Medicine

## 2020-05-26 ENCOUNTER — Encounter: Payer: Self-pay | Admitting: Adult Health

## 2020-05-26 ENCOUNTER — Ambulatory Visit (INDEPENDENT_AMBULATORY_CARE_PROVIDER_SITE_OTHER): Payer: Medicaid Other | Admitting: Adult Health

## 2020-05-26 VITALS — BP 116/82 | HR 108 | Ht 61.0 in | Wt 144.0 lb

## 2020-05-26 DIAGNOSIS — N921 Excessive and frequent menstruation with irregular cycle: Secondary | ICD-10-CM | POA: Insufficient documentation

## 2020-05-26 DIAGNOSIS — Z113 Encounter for screening for infections with a predominantly sexual mode of transmission: Secondary | ICD-10-CM

## 2020-05-26 DIAGNOSIS — B9689 Other specified bacterial agents as the cause of diseases classified elsewhere: Secondary | ICD-10-CM

## 2020-05-26 DIAGNOSIS — R3915 Urgency of urination: Secondary | ICD-10-CM

## 2020-05-26 DIAGNOSIS — Z3202 Encounter for pregnancy test, result negative: Secondary | ICD-10-CM | POA: Diagnosis not present

## 2020-05-26 DIAGNOSIS — N946 Dysmenorrhea, unspecified: Secondary | ICD-10-CM | POA: Insufficient documentation

## 2020-05-26 DIAGNOSIS — N760 Acute vaginitis: Secondary | ICD-10-CM | POA: Diagnosis not present

## 2020-05-26 DIAGNOSIS — N898 Other specified noninflammatory disorders of vagina: Secondary | ICD-10-CM | POA: Insufficient documentation

## 2020-05-26 DIAGNOSIS — Z30011 Encounter for initial prescription of contraceptive pills: Secondary | ICD-10-CM

## 2020-05-26 LAB — POCT WET PREP (WET MOUNT)
Clue Cells Wet Prep Whiff POC: POSITIVE
WBC, Wet Prep HPF POC: POSITIVE

## 2020-05-26 LAB — POCT URINE PREGNANCY: Preg Test, Ur: NEGATIVE

## 2020-05-26 MED ORDER — NORETHIN ACE-ETH ESTRAD-FE 1-20 MG-MCG PO TABS
1.0000 | ORAL_TABLET | Freq: Every day | ORAL | 11 refills | Status: DC
Start: 2020-05-26 — End: 2021-03-24

## 2020-05-26 MED ORDER — METRONIDAZOLE 500 MG PO TABS
500.0000 mg | ORAL_TABLET | Freq: Two times a day (BID) | ORAL | 0 refills | Status: DC
Start: 2020-05-26 — End: 2020-07-20

## 2020-05-26 NOTE — Progress Notes (Signed)
  Subjective:     Patient ID: Berlin Hun, female   DOB: 23-Nov-2000, 19 y.o.   MRN: [id number]  HPI Doris is a 19 year old white female,single, G0P0, in wanting STD testing and has multiple complaints see HPI. Has taken AZO. PCP Dr Gerda Diss.   Review of Systems +vaginal discharge with odor Itches since shaving +urinary urgency Has heavy period for 4-5 days, lasts 4-5 days Changes pads every hour  Has bad cramps with nausea Stopped OCs in February Had unprotected sex last week   Reviewed past medical,surgical, social and family history. Reviewed medications and allergies.     Objective:   Physical Exam BP 116/82 (BP Location: Left Arm, Patient Position: Sitting, Cuff Size: Normal)   Pulse (!) 108   Ht 5\' 1"  (1.549 m)   Wt 144 lb (65.3 kg)   LMP 05/12/2020 (Exact Date)   BMI 27.21 kg/m UPT is negative  Skin warm and dry.Pelvic: external genitalia is normal in appearance no lesions, vagina: white discharge with odor,urethra has no lesions or masses noted, cervix:smooth, uterus: normal size, shape and contour, non tender, no masses felt, adnexa: no masses or tenderness noted. Bladder is non tender and no masses felt. Wet prep: + for clue cells and +WBCs. Nuswab obtained.  Upstream - 05/26/20 1519      Pregnancy Intention Screening   Does the patient want to become pregnant in the next year? No    Does the patient's partner want to become pregnant in the next year? N/A    Would the patient like to discuss contraceptive options today? Yes      Contraception Wrap Up   Current Method No Method - Other Reason    End Method Oral Contraceptive    Contraception Counseling Provided Yes         Examination chaperoned by 05/28/20 LPN.     Assessment:     1. Urine pregnancy test negative  2. Vaginal discharge  3. Vaginal odor  4. BV (bacterial vaginosis) Will treat with flagyl, no sex or alcohol during treatment   Meds ordered this encounter  Medications  .  metroNIDAZOLE (FLAGYL) 500 MG tablet    Sig: Take 1 tablet (500 mg total) by mouth 2 (two) times daily.    Dispense:  14 tablet    Refill:  0    Order Specific Question:   Supervising Provider    Answer:   Faith Rogue, LUTHER H [[id number]]  . norethindrone-ethinyl estradiol (LOESTRIN FE) 1-20 MG-MCG tablet    Sig: Take 1 tablet by mouth daily.    Dispense:  28 tablet    Refill:  11    Order Specific Question:   Supervising Provider    Answer:   Despina Hidden, LUTHER H [[id number]]    5. Screening examination for STD (sexually transmitted disease) -nuswab sent  Check HIV and RPR and hepatitis C antibody   6. Urinary urgency UA C&S sent  7. Menorrhagia with irregular cycle Will restart OCs today, use condoms for at least 1 pack   8. Dysmenorrhea Will restart OCs  9. Encounter for initial prescription of contraceptive pills Will rx junel 1/20    Plan:     Follow up in 3 months

## 2020-05-27 LAB — URINALYSIS, ROUTINE W REFLEX MICROSCOPIC
Bilirubin, UA: NEGATIVE
Glucose, UA: NEGATIVE
Ketones, UA: NEGATIVE
Nitrite, UA: POSITIVE — AB
RBC, UA: NEGATIVE
Specific Gravity, UA: 1.023 (ref 1.005–1.030)
Urobilinogen, Ur: 1 mg/dL (ref 0.2–1.0)
pH, UA: 5.5 (ref 5.0–7.5)

## 2020-05-27 LAB — HIV ANTIBODY (ROUTINE TESTING W REFLEX): HIV Screen 4th Generation wRfx: NONREACTIVE

## 2020-05-27 LAB — HEPATITIS C ANTIBODY: Hep C Virus Ab: <0.1 s/co ratio (ref 0.0–0.9)

## 2020-05-27 LAB — RPR: RPR Ser Ql: NONREACTIVE

## 2020-05-27 LAB — MICROSCOPIC EXAMINATION
Casts: NONE SEEN /lpf
WBC, UA: NONE SEEN /hpf (ref 0–5)

## 2020-05-28 LAB — NUSWAB VAGINITIS PLUS (VG+)
Atopobium vaginae: HIGH Score — AB
BVAB 2: HIGH Score — AB
Candida albicans, NAA: NEGATIVE
Candida glabrata, NAA: NEGATIVE
Chlamydia trachomatis, NAA: NEGATIVE
Megasphaera 1: HIGH Score — AB
Neisseria gonorrhoeae, NAA: NEGATIVE
Trich vag by NAA: NEGATIVE

## 2020-05-28 LAB — URINE CULTURE: Organism ID, Bacteria: NO GROWTH

## 2020-07-20 ENCOUNTER — Other Ambulatory Visit (INDEPENDENT_AMBULATORY_CARE_PROVIDER_SITE_OTHER): Payer: Medicaid Other | Admitting: *Deleted

## 2020-07-20 DIAGNOSIS — R350 Frequency of micturition: Secondary | ICD-10-CM

## 2020-07-20 DIAGNOSIS — R309 Painful micturition, unspecified: Secondary | ICD-10-CM

## 2020-07-20 NOTE — Progress Notes (Addendum)
   NURSE VISIT- UTI SYMPTOMS   SUBJECTIVE:  Kaitlyn Meyer is a 19 y.o. G0P0000 female here for UTI symptoms. She is a GYN patient. She reports nausea, urinary frequency and pain with urination.  OBJECTIVE:  There were no vitals taken for this visit.  Appears well, in no apparent distress  No results found for this or any previous visit (from the past 24 hour(s)).  ASSESSMENT: GYN patient with UTI symptoms and negative nitrites. Unable to dip urine due to color from AZO.   PLAN: Note routed to Joellyn Haff, CNM   Rx sent by provider today: No Urine culture sent Call or return to clinic prn if these symptoms worsen or fail to improve as anticipated. Follow-up: as needed   [name]  07/20/2020 3:02 PM   Chart reviewed for nurse visit. Agree with plan of care.  Cheral Marker, [place] 07/20/2020 3:44 PM

## 2020-07-21 ENCOUNTER — Telehealth: Payer: Self-pay | Admitting: Women's Health

## 2020-07-21 LAB — URINALYSIS, ROUTINE W REFLEX MICROSCOPIC
Bilirubin, UA: NEGATIVE
Glucose, UA: NEGATIVE
Ketones, UA: NEGATIVE
Nitrite, UA: POSITIVE — AB
Specific Gravity, UA: 1.024 (ref 1.005–1.030)
Urobilinogen, Ur: 1 mg/dL (ref 0.2–1.0)
pH, UA: 7 (ref 5.0–7.5)

## 2020-07-21 LAB — MICROSCOPIC EXAMINATION
Casts: NONE SEEN /lpf
RBC, Urine: >30 /hpf — AB (ref 0–2)

## 2020-07-21 NOTE — Telephone Encounter (Signed)
Patient would like someone to review urine test results with her. Advised patient to check mychart as well.

## 2020-07-23 LAB — URINE CULTURE

## 2020-07-26 ENCOUNTER — Other Ambulatory Visit: Payer: Self-pay | Admitting: Women's Health

## 2020-07-26 MED ORDER — NITROFURANTOIN MONOHYD MACRO 100 MG PO CAPS: 100.0000 mg | ORAL_CAPSULE | Freq: Two times a day (BID) | ORAL | 0 refills | Status: DC

## 2020-08-01 ENCOUNTER — Encounter (HOSPITAL_COMMUNITY): Payer: Self-pay | Admitting: *Deleted

## 2020-08-01 ENCOUNTER — Emergency Department (HOSPITAL_COMMUNITY)
Admission: EM | Admit: 2020-08-01 | Discharge: 2020-08-01 | Disposition: A | Discharge status: Home / Self Care | Payer: Medicaid Other | Attending: Emergency Medicine | Admitting: Emergency Medicine

## 2020-08-01 ENCOUNTER — Other Ambulatory Visit: Payer: Self-pay

## 2020-08-01 DIAGNOSIS — N3001 Acute cystitis with hematuria: Secondary | ICD-10-CM | POA: Diagnosis not present

## 2020-08-01 DIAGNOSIS — Z87891 Personal history of nicotine dependence: Secondary | ICD-10-CM | POA: Insufficient documentation

## 2020-08-01 DIAGNOSIS — R110 Nausea: Secondary | ICD-10-CM | POA: Diagnosis present

## 2020-08-01 DIAGNOSIS — N946 Dysmenorrhea, unspecified: Secondary | ICD-10-CM | POA: Insufficient documentation

## 2020-08-01 LAB — URINALYSIS, ROUTINE W REFLEX MICROSCOPIC
Bilirubin Urine: NEGATIVE
Glucose, UA: 50 mg/dL — AB
Ketones, ur: 5 mg/dL — AB
Nitrite: POSITIVE — AB
Protein, ur: 100 mg/dL — AB
RBC / HPF: >50 RBC/hpf — ABNORMAL HIGH (ref 0–5)
Specific Gravity, Urine: 1.018 (ref 1.005–1.030)
pH: 6 (ref 5.0–8.0)

## 2020-08-01 LAB — PREGNANCY, URINE: Preg Test, Ur: NEGATIVE

## 2020-08-01 MED ORDER — KETOROLAC TROMETHAMINE 30 MG/ML IJ SOLN
30.0000 mg | Freq: Once | INTRAMUSCULAR | Status: AC
  Administered 2020-08-01: 30 mg via INTRAMUSCULAR

## 2020-08-01 MED ORDER — KETOROLAC TROMETHAMINE 30 MG/ML IJ SOLN
30.0000 mg | Freq: Once | INTRAMUSCULAR | Status: DC
  Filled 2020-08-01: qty 1

## 2020-08-01 MED ORDER — IBUPROFEN 600 MG PO TABS: 600.0000 mg | ORAL_TABLET | Freq: Four times a day (QID) | ORAL | 0 refills | Status: DC | PRN

## 2020-08-01 MED ORDER — PHENAZOPYRIDINE HCL 200 MG PO TABS: 200.0000 mg | ORAL_TABLET | Freq: Three times a day (TID) | ORAL | 0 refills | Status: DC

## 2020-08-01 MED ORDER — HYDROCODONE-ACETAMINOPHEN 5-325 MG PO TABS
1.0000 | ORAL_TABLET | Freq: Once | ORAL | Status: AC
  Administered 2020-08-01: 1 via ORAL
  Filled 2020-08-01: qty 1

## 2020-08-01 NOTE — Discharge Instructions (Signed)
Continue taking the antibiotics prescribed by your regular doctor for a UTI.  You were given ibuprofen to take for your menstrual cramps as well as Pyridium to help with bladder spasms during your urinary tract infection.  Please make an appointment to follow-up with your OB/GYN within the next 5 to 7 days for reassessment and return to the emergency department for new or worsening symptoms.

## 2020-08-01 NOTE — ED Triage Notes (Signed)
Pt states she started her menstrual cycle last night and when she got out of bed this am she started having severe abdominal cramps; pt states she took midol with no relief

## 2020-08-01 NOTE — ED Provider Notes (Signed)
[facility] EMERGENCY DEPARTMENT Provider Note   CSN: [id number] Arrival date & time: 08/01/20  1055     History Chief Complaint  Patient presents with  . Dysmenorrhea    Kaitlyn Meyer is a 19 y.o. female.  HPI   Pt is a 19 y/o with a h/o dysmenorrhia who presents to the ED today for eval of dysmenorrhea. States she started her menstrual cycle last night and this morning started experiencing severe menstraul cramps. States she has a h/o severe cramps but today they were worse. She took midol about 1 hour PTA and sxs have not improved. Reports associated nausea and diarrhea which are both normal on her menstrual cycle. Denies any vomiting or constipation.   She denies dysuria, fevers, urinary frequency. She was having hematuria last week which has since improved.   She states she currently has a UTI and was diagnosed last week. States she was just able to finally pick up the medication and started it yesteray. Per chart review she is on macrobid  History reviewed. No pertinent past medical history.  Patient Active Problem List   Diagnosis Date Noted  . Dysmenorrhea 05/26/2020  . Menorrhagia with irregular cycle 05/26/2020  . BV (bacterial vaginosis) 05/26/2020    Past Surgical History:  Procedure Laterality Date  . NO PAST SURGERIES       OB History    Gravida  0   Para  0   Term  0   Preterm  0   AB  0   Living  0     SAB  0   TAB  0   Ectopic  0   Multiple  0   Live Births  0           Family History  Problem Relation Age of Onset  . Healthy Mother   . Diabetes Mother   . Hypertension Mother   . Hyperlipidemia Mother   . Healthy Father   . Diabetes Father     Social History   Tobacco Use  . Smoking status: Former Games developer  . Smokeless tobacco: Never Used  Vaping Use  . Vaping Use: Never used  Substance Use Topics  . Alcohol use: No  . Drug use: No    Home Medications Prior to Admission medications   Medication Sig Start  Date End Date Taking? Authorizing Provider  Acetaminophen-Caff-Pyrilamine (MIDOL COMPLETE PO) Take 2 tablets by mouth every 6 (six) hours as needed (menstrual relief).   Yes [provider]  nitrofurantoin, macrocrystal-monohydrate, (MACROBID) 100 MG capsule Take 1 capsule (100 mg total) by mouth 2 (two) times daily. X 7 days 07/26/20  Yes Cheral Marker, CNM  ibuprofen (ADVIL) 600 MG tablet Take 1 tablet (600 mg total) by mouth every 6 (six) hours as needed. 08/01/20   Caetano Oberhaus S, PA-C  norethindrone-ethinyl estradiol (LOESTRIN FE) 1-20 MG-MCG tablet Take 1 tablet by mouth daily. Patient not taking: Reported on 07/20/2020 05/26/20 05/26/21  Adline Potter, NP  phenazopyridine (PYRIDIUM) 200 MG tablet Take 1 tablet (200 mg total) by mouth 3 (three) times daily. 08/01/20   Callaway Hardigree S, PA-C  fluticasone (FLONASE) 50 MCG/ACT nasal spray Place 2 sprays into both nostrils daily. 09/12/19 01/03/20  Domenick Gong, MD  Norethindrone-Ethinyl Estradiol-Fe Biphas (LO LOESTRIN FE) 1 MG-10 MCG / 10 MCG tablet Take 1 tablet by mouth daily. 04/24/19 01/03/20  Adline Potter, NP  traZODone (DESYREL) 50 MG tablet TAKE 1 2 TABLET 2 TIMES DAILY AS  NEEDED FOR ANXIETY AND 1 2 BY MOUTH AT BEDTIME FOR INSOMNIA AS NEEDED 09/09/19 01/03/20  [provider]    Allergies    Patient has no known allergies.  Review of Systems   Review of Systems  Constitutional: Negative for chills and fever.  HENT: Negative for ear pain and sore throat.   Eyes: Negative for visual disturbance.  Respiratory: Negative for cough and shortness of breath.   Cardiovascular: Negative for chest pain.  Gastrointestinal: Positive for diarrhea and nausea. Negative for abdominal pain and vomiting.  Genitourinary: Positive for pelvic pain and vaginal bleeding (on menses). Negative for dysuria, hematuria, urgency and vaginal discharge.  Musculoskeletal: Negative for back pain.  Skin: Negative for rash.    Neurological: Negative for seizures and syncope.  All other systems reviewed and are negative.   Physical Exam Updated Vital Signs BP 113/72 (BP Location: Right Arm)   Pulse 90   Temp 98.8 F (37.1 C) (Oral)   Resp 16   Ht 5\' 1"  (1.549 m)   Wt 63.5 kg   LMP 08/01/2020   SpO2 100%   BMI 26.45 kg/m   Physical Exam Vitals and nursing note reviewed.  Constitutional:      General: She is not in acute distress.    Appearance: She is well-developed.  HENT:     Head: Normocephalic and atraumatic.  Eyes:     Conjunctiva/sclera: Conjunctivae normal.  Cardiovascular:     Rate and Rhythm: Normal rate and regular rhythm.     Heart sounds: Normal heart sounds. No murmur heard.   Pulmonary:     Effort: Pulmonary effort is normal. No respiratory distress.     Breath sounds: Normal breath sounds. No wheezing, rhonchi or rales.  Abdominal:     General: Bowel sounds are normal.     Palpations: Abdomen is soft.     Tenderness: There is abdominal tenderness (bilat lower abd). There is no guarding or rebound.  Musculoskeletal:     Cervical back: Neck supple.  Skin:    General: Skin is warm and dry.  Neurological:     Mental Status: She is alert.     ED Results / Procedures / Treatments   Labs (all labs ordered are listed, but only abnormal results are displayed) Labs Reviewed  URINALYSIS, ROUTINE W REFLEX MICROSCOPIC - Abnormal; Notable for the following components:      Result Value   Color, Urine RED (*)    APPearance CLOUDY (*)    Glucose, UA 50 (*)    Hgb urine dipstick LARGE (*)    Ketones, ur 5 (*)    Protein, ur 100 (*)    Nitrite POSITIVE (*)    Leukocytes,Ua TRACE (*)    RBC / HPF >50 (*)    Bacteria, UA MANY (*)    All other components within normal limits  PREGNANCY, URINE    EKG None  Radiology No results found.  Procedures Procedures (including critical care time)  Medications Ordered in ED Medications  ketorolac (TORADOL) 30 MG/ML injection 30  mg (30 mg Intramuscular Given 08/01/20 1208)  HYDROcodone-acetaminophen (NORCO/VICODIN) 5-325 MG per tablet 1 tablet (1 tablet Oral Given 08/01/20 1218)    ED Course  I have reviewed the triage vital signs and the nursing notes.  Pertinent labs & imaging results that were available during my care of the patient were reviewed by me and considered in my medical decision making (see chart for details).    MDM Rules/Calculators/A&P  19 year old female with a history of dysmenorrhea presenting to the emergency department today for menstrual cramping.  Started menses last night and this morning had severe menstrual cramping unresolved with Midol.  Presents here for further evaluation  Mild lower abdominal tenderness noted on exam without rebound rigidity or guarding.  Will check UA and urine pregnancy test.  She does state that she was diagnosed with a UTI recently and started on antibiotics yesterday.  UA + for UTI. Reviewed culture results from 07/20/2020 when she was first diagnosed and this showed staph saphrophyticus which should be susceptible to macrobid. Do not feel she has failed outpatient therapy as she just started rx yesterday Urine Preg neg  Patient was given Toradol and Norco.  On reassessment she feels completely improved and no longer has pain. Reviewed results which confirm UTI. She can continue her nitrofurantoin.  I will also give Rx for ibuprofen for her.  Cramps and Pyridium for her UTI.  Have advised her to follow-up with her OB/GYN and return to the emergency department for new or worsening symptoms.  She voiced understanding the plan and reasons to return.  All questions answered.  Patient stable for discharge.  Final Clinical Impression(s) / ED Diagnoses Final diagnoses:  Severe menstrual cramps  Acute cystitis with hematuria    Rx / DC Orders ED Discharge Orders         Ordered    ibuprofen (ADVIL) 600 MG tablet  Every 6 hours PRN         08/01/20 1330    phenazopyridine (PYRIDIUM) 200 MG tablet  3 times daily        08/01/20 [address], Esteen Delpriore S, PA-C 08/01/20 1330    Bethann Berkshire, MD 08/02/20 [phone]

## 2020-08-26 ENCOUNTER — Ambulatory Visit: Payer: Medicaid Other | Admitting: Adult Health

## 2020-08-27 ENCOUNTER — Ambulatory Visit: Payer: Medicaid Other | Admitting: Adult Health

## 2020-11-12 ENCOUNTER — Other Ambulatory Visit: Payer: Self-pay

## 2020-11-12 ENCOUNTER — Other Ambulatory Visit (INDEPENDENT_AMBULATORY_CARE_PROVIDER_SITE_OTHER): Payer: Medicaid Other | Admitting: *Deleted

## 2020-11-12 DIAGNOSIS — R300 Dysuria: Secondary | ICD-10-CM | POA: Diagnosis not present

## 2020-11-12 LAB — POCT URINALYSIS DIPSTICK OB
Glucose, UA: NEGATIVE
Ketones, UA: NEGATIVE
Leukocytes, UA: NEGATIVE
Nitrite, UA: NEGATIVE
POC,PROTEIN,UA: NEGATIVE

## 2020-11-12 NOTE — Progress Notes (Signed)
Chart reviewed for nurse visit. Agree with plan of care.  Cyril Mourning A, NP 11/12/2020 1:16 PM

## 2020-11-12 NOTE — Progress Notes (Signed)
   NURSE VISIT- UTI SYMPTOMS   SUBJECTIVE:  Kaitlyn Meyer is a 20 y.o. G0P0000 female here for UTI symptoms. She is a GYN patient. She reports dysuria, urinary frequency and urinary urgency.  OBJECTIVE:  There were no vitals taken for this visit.  Appears well, in no apparent distress  Results for orders placed or performed in visit on 11/12/20 (from the past 24 hour(s))  POC Urinalysis Dipstick OB   Collection Time: 11/12/20 11:55 AM  Result Value Ref Range   Color, UA     Clarity, UA     Glucose, UA Negative Negative   Bilirubin, UA     Ketones, UA neg    Spec Grav, UA     Blood, UA trace    pH, UA     POC,PROTEIN,UA Negative Negative, Trace, Small (1+), Moderate (2+), Large (3+), 4+   Urobilinogen, UA     Nitrite, UA neg    Leukocytes, UA Negative Negative   Appearance     Odor      ASSESSMENT: GYN patient with UTI symptoms and negative nitrites  PLAN: Note routed to Cyril Mourning, AGNP   Rx sent by provider today: No Urine culture sent Call or return to clinic prn if these symptoms worsen or fail to improve as anticipated. Follow-up: as needed   Annamarie Dawley  11/12/2020 11:57 AM

## 2020-11-13 LAB — URINALYSIS
Bilirubin, UA: NEGATIVE
Glucose, UA: NEGATIVE
Ketones, UA: NEGATIVE
Nitrite, UA: NEGATIVE
Protein,UA: NEGATIVE
RBC, UA: NEGATIVE
Specific Gravity, UA: 1.024 (ref 1.005–1.030)
Urobilinogen, Ur: 0.2 mg/dL (ref 0.2–1.0)
pH, UA: 6 (ref 5.0–7.5)

## 2020-11-15 LAB — URINE CULTURE

## 2020-11-19 ENCOUNTER — Other Ambulatory Visit (HOSPITAL_COMMUNITY)
Admission: RE | Admit: 2020-11-19 | Discharge: 2020-11-19 | Disposition: A | Discharge status: Home / Self Care | Payer: Medicaid Other | Source: Ambulatory Visit | Attending: Obstetrics & Gynecology | Admitting: Obstetrics & Gynecology

## 2020-11-19 ENCOUNTER — Other Ambulatory Visit: Payer: Self-pay

## 2020-11-19 ENCOUNTER — Other Ambulatory Visit (INDEPENDENT_AMBULATORY_CARE_PROVIDER_SITE_OTHER): Payer: Medicaid Other | Admitting: *Deleted

## 2020-11-19 DIAGNOSIS — Z113 Encounter for screening for infections with a predominantly sexual mode of transmission: Secondary | ICD-10-CM | POA: Insufficient documentation

## 2020-11-19 NOTE — Progress Notes (Signed)
   NURSE VISIT- VAGINITIS/STD/POC  SUBJECTIVE:  Kaitlyn Meyer is a 20 y.o. G0P0000 GYN patientfemale here for a vaginal swab for STD screen.  She reports the following symptoms: none for 0 day. Denies abnormal vaginal bleeding, significant pelvic pain, fever, or UTI symptoms.  OBJECTIVE:  There were no vitals taken for this visit.  Appears well, in no apparent distress  ASSESSMENT: Vaginal swab for STD screen  PLAN: Self-collected vaginal probe for Gonorrhea, Chlamydia, Trichomonas, Bacterial Vaginosis, Yeast sent to lab Treatment: to be determined once results are received Follow-up as needed if symptoms persist/worsen, or new symptoms develop  Annamarie Dawley  11/19/2020 9:50 AM

## 2020-11-19 NOTE — Progress Notes (Signed)
Chart reviewed for nurse visit. Agree with plan of care.  Adline Potter, NP 11/19/2020 10:51 AM

## 2020-11-23 LAB — CERVICOVAGINAL ANCILLARY ONLY
Bacterial Vaginitis (gardnerella): NEGATIVE
Candida Glabrata: NEGATIVE
Candida Vaginitis: NEGATIVE
Chlamydia: NEGATIVE
Comment: NEGATIVE
Comment: NEGATIVE
Comment: NEGATIVE
Comment: NEGATIVE
Comment: NEGATIVE
Comment: NORMAL
Neisseria Gonorrhea: NEGATIVE
Trichomonas: NEGATIVE

## 2021-01-07 ENCOUNTER — Encounter (HOSPITAL_COMMUNITY): Payer: Self-pay | Admitting: Emergency Medicine

## 2021-01-07 ENCOUNTER — Other Ambulatory Visit: Payer: Self-pay

## 2021-01-07 ENCOUNTER — Emergency Department (HOSPITAL_COMMUNITY)
Admission: EM | Admit: 2021-01-07 | Discharge: 2021-01-08 | Disposition: A | Discharge status: Home / Self Care | Payer: Medicaid Other | Attending: Emergency Medicine | Admitting: Emergency Medicine

## 2021-01-07 DIAGNOSIS — N3000 Acute cystitis without hematuria: Secondary | ICD-10-CM

## 2021-01-07 DIAGNOSIS — N730 Acute parametritis and pelvic cellulitis: Secondary | ICD-10-CM | POA: Insufficient documentation

## 2021-01-07 DIAGNOSIS — Z87891 Personal history of nicotine dependence: Secondary | ICD-10-CM | POA: Diagnosis not present

## 2021-01-07 DIAGNOSIS — R350 Frequency of micturition: Secondary | ICD-10-CM | POA: Diagnosis present

## 2021-01-07 LAB — URINALYSIS, ROUTINE W REFLEX MICROSCOPIC
Bacteria, UA: NONE SEEN
Bilirubin Urine: NEGATIVE
Glucose, UA: NEGATIVE mg/dL
Ketones, ur: NEGATIVE mg/dL
Nitrite: NEGATIVE
Protein, ur: 100 mg/dL — AB
RBC / HPF: >50 RBC/hpf — ABNORMAL HIGH (ref 0–5)
Specific Gravity, Urine: 1.019 (ref 1.005–1.030)
WBC, UA: >50 WBC/hpf — ABNORMAL HIGH (ref 0–5)
pH: 6 (ref 5.0–8.0)

## 2021-01-07 LAB — PREGNANCY, URINE: Preg Test, Ur: NEGATIVE

## 2021-01-07 MED ORDER — ONDANSETRON 4 MG PO TBDP
4.0000 mg | ORAL_TABLET | Freq: Once | ORAL | Status: AC
  Administered 2021-01-08: 4 mg via ORAL
  Filled 2021-01-07: qty 1

## 2021-01-07 MED ORDER — CEPHALEXIN 500 MG PO CAPS
500.0000 mg | ORAL_CAPSULE | Freq: Once | ORAL | Status: AC
  Administered 2021-01-08: 500 mg via ORAL
  Filled 2021-01-07: qty 1

## 2021-01-07 NOTE — ED Provider Notes (Signed)
[facility] EMERGENCY DEPARTMENT Provider Note   CSN: [id number] Arrival date & time: 01/07/21  2235     History Chief Complaint  Patient presents with  . Urinary Frequency    Kaitlyn Meyer is a 20 y.o. female.  Patient presents with concern for UTI.  States she was having urinary frequency, urgency, burning pain with urination and suprapubic pain all day.  The pain is constant and worse when she tries to urinate.  She has had cloudy urine and burning when she goes.  Has diffuse low back pain as well.  Nausea but no vomiting.  No fever.  Has vaginal discharge with this is not new.  Sexually active with one partner earlier this week.  Denies any irregular vaginal bleeding.  Denies any history of kidney stones. Has seen gynecology in the past for recurrent UTIs vaginal discharge without a diagnosis  The history is provided by the patient.  Urinary Frequency Associated symptoms include abdominal pain. Pertinent negatives include no chest pain, no headaches and no shortness of breath.       History reviewed. No pertinent past medical history.  Patient Active Problem List   Diagnosis Date Noted  . Dysmenorrhea 05/26/2020  . Menorrhagia with irregular cycle 05/26/2020  . BV (bacterial vaginosis) 05/26/2020    Past Surgical History:  Procedure Laterality Date  . NO PAST SURGERIES       OB History    Gravida  0   Para  0   Term  0   Preterm  0   AB  0   Living  0     SAB  0   IAB  0   Ectopic  0   Multiple  0   Live Births  0           Family History  Problem Relation Age of Onset  . Healthy Mother   . Diabetes Mother   . Hypertension Mother   . Hyperlipidemia Mother   . Healthy Father   . Diabetes Father     Social History   Tobacco Use  . Smoking status: Former Games developer  . Smokeless tobacco: Never Used  Vaping Use  . Vaping Use: Never used  Substance Use Topics  . Alcohol use: No  . Drug use: No    Home Medications Prior to  Admission medications   Medication Sig Start Date End Date Taking? Authorizing Provider  Acetaminophen-Caff-Pyrilamine (MIDOL COMPLETE PO) Take 2 tablets by mouth every 6 (six) hours as needed (menstrual relief).    [provider]  ibuprofen (ADVIL) 600 MG tablet Take 1 tablet (600 mg total) by mouth every 6 (six) hours as needed. 08/01/20   Couture, Cortni S, PA-C  nitrofurantoin, macrocrystal-monohydrate, (MACROBID) 100 MG capsule Take 1 capsule (100 mg total) by mouth 2 (two) times daily. X 7 days 07/26/20   Cheral Marker, CNM  norethindrone-ethinyl estradiol (LOESTRIN FE) 1-20 MG-MCG tablet Take 1 tablet by mouth daily. Patient not taking: Reported on 07/20/2020 05/26/20 05/26/21  Adline Potter, NP  phenazopyridine (PYRIDIUM) 200 MG tablet Take 1 tablet (200 mg total) by mouth 3 (three) times daily. 08/01/20   Couture, Cortni S, PA-C  fluticasone (FLONASE) 50 MCG/ACT nasal spray Place 2 sprays into both nostrils daily. 09/12/19 01/03/20  Domenick Gong, MD  Norethindrone-Ethinyl Estradiol-Fe Biphas (LO LOESTRIN FE) 1 MG-10 MCG / 10 MCG tablet Take 1 tablet by mouth daily. 04/24/19 01/03/20  Adline Potter, NP  traZODone (DESYREL) 50 MG tablet  TAKE 1 2 TABLET 2 TIMES DAILY AS NEEDED FOR ANXIETY AND 1 2 BY MOUTH AT BEDTIME FOR INSOMNIA AS NEEDED 09/09/19 01/03/20  [provider]    Allergies    Patient has no known allergies.  Review of Systems   Review of Systems  Constitutional: Negative for activity change, appetite change and fever.  HENT: Negative for congestion and rhinorrhea.   Eyes: Negative for visual disturbance.  Respiratory: Negative for cough and shortness of breath.   Cardiovascular: Negative for chest pain.  Gastrointestinal: Positive for abdominal pain and nausea. Negative for vomiting.  Genitourinary: Positive for dysuria, frequency, urgency and vaginal discharge. Negative for flank pain, hematuria and vaginal bleeding.  Musculoskeletal: Negative  for arthralgias, back pain and myalgias.  Skin: Negative for rash.  Neurological: Negative for dizziness, weakness and headaches.   all other systems are negative except as noted in the HPI and PMH.    Physical Exam Updated Vital Signs BP 107/73   Pulse 75   Temp 98.4 F (36.9 C)   Resp 18   Ht 5\' 1"  (1.549 m)   Wt 63.5 kg   LMP 12/02/2020   SpO2 100%   BMI 26.45 kg/m   Physical Exam Vitals and nursing note reviewed.  Constitutional:      General: She is not in acute distress.    Appearance: She is well-developed.  HENT:     Head: Normocephalic and atraumatic.     Mouth/Throat:     Pharynx: No oropharyngeal exudate.  Eyes:     Conjunctiva/sclera: Conjunctivae normal.     Pupils: Pupils are equal, round, and reactive to light.  Neck:     Comments: No meningismus. Cardiovascular:     Rate and Rhythm: Normal rate and regular rhythm.     Heart sounds: Normal heart sounds. No murmur heard.   Pulmonary:     Effort: Pulmonary effort is normal. No respiratory distress.     Breath sounds: Normal breath sounds.  Abdominal:     Palpations: Abdomen is soft.     Tenderness: There is abdominal tenderness. There is no guarding or rebound.     Comments: Suprapubic tenderness, no guarding or rebound, no right lower quadrant pain.  Genitourinary:    Comments: Chaperone present Angelina NT.  Normal external genitalia.  There is white discharge in vaginal vault.  Cervix is friable and tender to palpation.  Midline uterine tenderness.  No lateralized adnexal tenderness. Musculoskeletal:        General: No tenderness. Normal range of motion.     Cervical back: Normal range of motion and neck supple.     Comments: No CVA tenderness  Skin:    General: Skin is warm.  Neurological:     Mental Status: She is alert and oriented to person, place, and time.     Cranial Nerves: No cranial nerve deficit.     Motor: No abnormal muscle tone.     Coordination: Coordination normal.      Comments: No ataxia on finger to nose bilaterally. No pronator drift. 5/5 strength throughout. CN 2-12 intact.Equal grip strength. Sensation intact.   Psychiatric:        Behavior: Behavior normal.     ED Results / Procedures / Treatments   Labs (all labs ordered are listed, but only abnormal results are displayed) Labs Reviewed  WET PREP, GENITAL - Abnormal; Notable for the following components:      Result Value   WBC, Wet Prep HPF POC FEW (*)  All other components within normal limits  URINALYSIS, ROUTINE W REFLEX MICROSCOPIC - Abnormal; Notable for the following components:   Color, Urine AMBER (*)    APPearance HAZY (*)    Hgb urine dipstick SMALL (*)    Protein, ur 100 (*)    Leukocytes,Ua SMALL (*)    RBC / HPF >50 (*)    WBC, UA >50 (*)    Non Squamous Epithelial 6-10 (*)    All other components within normal limits  URINE CULTURE  PREGNANCY, URINE  GC/CHLAMYDIA PROBE AMP ([place]) NOT AT [facility]    EKG None  Radiology No results found.  Procedures Procedures   Medications Ordered in ED Medications  cephALEXin (KEFLEX) capsule 500 mg (has no administration in time range)  ondansetron (ZOFRAN-ODT) disintegrating tablet 4 mg (has no administration in time range)    ED Course  I have reviewed the triage vital signs and the nursing notes.  Pertinent labs & imaging results that were available during my care of the patient were reviewed by me and considered in my medical decision making (see chart for details).    MDM Rules/Calculators/A&P                         Dysuria, frequency, urgency with suprapubic pain for several days.  No fever.  Vitals are stable.  No flank pain to suggest pyelonephritis or infected kidney stone.  Urinalysis consistent with leukocyte esterase, many red cells and white cells.  Will send culture and treat empirically for cystitis.  Pelvic exam suggestive of PID.  Low suspicion for tubo-ovarian abscess.  Will treat empirically  with Rocephin and doxycycline and flagyl.  Follow-up with PCP as well as gynecology.  Discussed safe sex practices and that her sexual partner should be treated as well.  Return to the ED with worsening pain, fever, vomiting, any other concerns. Final Clinical Impression(s) / ED Diagnoses Final diagnoses:  Acute cystitis without hematuria  PID (acute pelvic inflammatory disease)    Rx / DC Orders ED Discharge Orders    None       Trang Bouse, Jeannett Senior, MD 01/08/21 [phone]

## 2021-01-07 NOTE — ED Triage Notes (Signed)
Pt states that she thinks she has a UTI or STD. States she is having urinary frequency, burning with urination, suprapubic pain and nausea.

## 2021-01-08 LAB — WET PREP, GENITAL
Clue Cells Wet Prep HPF POC: NONE SEEN
Sperm: NONE SEEN
Trich, Wet Prep: NONE SEEN
Yeast Wet Prep HPF POC: NONE SEEN

## 2021-01-08 MED ORDER — DOXYCYCLINE HYCLATE 100 MG PO CAPS: 100.0000 mg | ORAL_CAPSULE | Freq: Two times a day (BID) | ORAL | 0 refills | Status: DC

## 2021-01-08 MED ORDER — STERILE WATER FOR INJECTION IJ SOLN
INTRAMUSCULAR | Status: AC
  Filled 2021-01-08: qty 10

## 2021-01-08 MED ORDER — METRONIDAZOLE 500 MG PO TABS
500.0000 mg | ORAL_TABLET | Freq: Once | ORAL | Status: AC
  Administered 2021-01-08: 500 mg via ORAL
  Filled 2021-01-08: qty 1

## 2021-01-08 MED ORDER — METRONIDAZOLE 500 MG PO TABS: 500.0000 mg | ORAL_TABLET | Freq: Two times a day (BID) | ORAL | 0 refills | Status: DC

## 2021-01-08 MED ORDER — CEPHALEXIN 500 MG PO CAPS: 500.0000 mg | ORAL_CAPSULE | Freq: Three times a day (TID) | ORAL | 0 refills | Status: DC

## 2021-01-08 MED ORDER — DOXYCYCLINE HYCLATE 100 MG PO TABS
100.0000 mg | ORAL_TABLET | Freq: Once | ORAL | Status: AC
  Administered 2021-01-08: 100 mg via ORAL
  Filled 2021-01-08: qty 1

## 2021-01-08 MED ORDER — CEFTRIAXONE SODIUM 500 MG IJ SOLR
500.0000 mg | Freq: Once | INTRAMUSCULAR | Status: AC
  Administered 2021-01-08: 500 mg via INTRAMUSCULAR
  Filled 2021-01-08: qty 500

## 2021-01-08 NOTE — Discharge Instructions (Addendum)
Take the antibiotics as prescribed and follow-up with your doctor and gynecologist.  You will be called if the antibiotic for your urinary tract infection needs to be changed.  Your sexual partner should be treated for any symptoms as well.  Use a condom with every sexual encounter. Return to the ED with worsening pain, fever, vomiting, other concerns

## 2021-01-09 LAB — GC/CHLAMYDIA PROBE AMP (~~LOC~~) NOT AT ARMC
Chlamydia: NEGATIVE
Comment: NEGATIVE
Comment: NORMAL
Neisseria Gonorrhea: NEGATIVE

## 2021-01-10 LAB — URINE CULTURE: Culture: 60000 — AB

## 2021-02-23 ENCOUNTER — Ambulatory Visit: Payer: Medicaid Other | Admitting: Advanced Practice Midwife

## 2021-02-27 ENCOUNTER — Encounter (HOSPITAL_COMMUNITY): Payer: Self-pay | Admitting: *Deleted

## 2021-02-27 ENCOUNTER — Other Ambulatory Visit: Payer: Self-pay

## 2021-02-27 ENCOUNTER — Emergency Department (HOSPITAL_COMMUNITY)
Admission: EM | Admit: 2021-02-27 | Discharge: 2021-02-27 | Disposition: A | Discharge status: Home / Self Care | Payer: Medicaid Other | Attending: Emergency Medicine | Admitting: Emergency Medicine

## 2021-02-27 DIAGNOSIS — R110 Nausea: Secondary | ICD-10-CM | POA: Diagnosis not present

## 2021-02-27 DIAGNOSIS — Z87891 Personal history of nicotine dependence: Secondary | ICD-10-CM | POA: Diagnosis not present

## 2021-02-27 DIAGNOSIS — R102 Pelvic and perineal pain: Secondary | ICD-10-CM

## 2021-02-27 DIAGNOSIS — R1030 Lower abdominal pain, unspecified: Secondary | ICD-10-CM | POA: Insufficient documentation

## 2021-02-27 LAB — URINALYSIS, ROUTINE W REFLEX MICROSCOPIC
Bilirubin Urine: NEGATIVE
Glucose, UA: NEGATIVE mg/dL
Hgb urine dipstick: NEGATIVE
Ketones, ur: NEGATIVE mg/dL
Leukocytes,Ua: NEGATIVE
Nitrite: NEGATIVE
Protein, ur: NEGATIVE mg/dL
Specific Gravity, Urine: 1.019 (ref 1.005–1.030)
pH: 6 (ref 5.0–8.0)

## 2021-02-27 LAB — POC URINE PREG, ED: Preg Test, Ur: NEGATIVE

## 2021-02-27 NOTE — ED Triage Notes (Signed)
Pt with lower back pain and abd cramping.  Unable to see OB/GYN per pt.

## 2021-02-27 NOTE — ED Provider Notes (Signed)
[facility] EMERGENCY DEPARTMENT Provider Note   CSN: [id number] Arrival date & time: 02/27/21  1314     History Chief Complaint  Patient presents with  . Abdominal Pain    Kaitlyn Meyer is a 20 y.o. female.  20 year old female with complaint of suprapubic abdominal pain, seen here on 01/07/2021 for same, diagnosed with PID, given Rocephin and doxycycline as well as Keflex for UTI.  Patient completed treatment, has ongoing suprapubic pain without dysuria or frequency today.  Denies vaginal discharge, changes in bowel habits.  Reports nausea without vomiting, denies fevers.  No other complaints or concerns.        History reviewed. No pertinent past medical history.  Patient Active Problem List   Diagnosis Date Noted  . Dysmenorrhea 05/26/2020  . Menorrhagia with irregular cycle 05/26/2020  . BV (bacterial vaginosis) 05/26/2020    Past Surgical History:  Procedure Laterality Date  . NO PAST SURGERIES       OB History    Gravida  0   Para  0   Term  0   Preterm  0   AB  0   Living  0     SAB  0   IAB  0   Ectopic  0   Multiple  0   Live Births  0           Family History  Problem Relation Age of Onset  . Healthy Mother   . Diabetes Mother   . Hypertension Mother   . Hyperlipidemia Mother   . Healthy Father   . Diabetes Father     Social History   Tobacco Use  . Smoking status: Former Games developer  . Smokeless tobacco: Never Used  Vaping Use  . Vaping Use: Never used  Substance Use Topics  . Alcohol use: No  . Drug use: No    Home Medications Prior to Admission medications   Medication Sig Start Date End Date Taking? Authorizing Provider  Acetaminophen-Caff-Pyrilamine (MIDOL COMPLETE PO) Take 2 tablets by mouth every 6 (six) hours as needed (menstrual relief).    [provider]  cephALEXin (KEFLEX) 500 MG capsule Take 1 capsule (500 mg total) by mouth 3 (three) times daily. 01/08/21   Rancour, Jeannett Senior, MD  doxycycline  (VIBRAMYCIN) 100 MG capsule Take 1 capsule (100 mg total) by mouth 2 (two) times daily. 01/08/21   Rancour, Jeannett Senior, MD  ibuprofen (ADVIL) 600 MG tablet Take 1 tablet (600 mg total) by mouth every 6 (six) hours as needed. 08/01/20   Couture, Cortni S, PA-C  metroNIDAZOLE (FLAGYL) 500 MG tablet Take 1 tablet (500 mg total) by mouth 2 (two) times daily. 01/08/21   Rancour, Jeannett Senior, MD  norethindrone-ethinyl estradiol (LOESTRIN FE) 1-20 MG-MCG tablet Take 1 tablet by mouth daily. Patient not taking: Reported on 07/20/2020 05/26/20 05/26/21  Adline Potter, NP  phenazopyridine (PYRIDIUM) 200 MG tablet Take 1 tablet (200 mg total) by mouth 3 (three) times daily. 08/01/20   Couture, Cortni S, PA-C  fluticasone (FLONASE) 50 MCG/ACT nasal spray Place 2 sprays into both nostrils daily. 09/12/19 01/03/20  Domenick Gong, MD  Norethindrone-Ethinyl Estradiol-Fe Biphas (LO LOESTRIN FE) 1 MG-10 MCG / 10 MCG tablet Take 1 tablet by mouth daily. 04/24/19 01/03/20  Adline Potter, NP  traZODone (DESYREL) 50 MG tablet TAKE 1 2 TABLET 2 TIMES DAILY AS NEEDED FOR ANXIETY AND 1 2 BY MOUTH AT BEDTIME FOR INSOMNIA AS NEEDED 09/09/19 01/03/20  [provider]  Allergies    Patient has no known allergies.  Review of Systems   Review of Systems  Constitutional: Negative for chills and fever.  Respiratory: Negative for shortness of breath.   Cardiovascular: Negative for chest pain.  Gastrointestinal: Positive for abdominal pain and nausea. Negative for constipation, diarrhea and vomiting.  Genitourinary: Negative for dysuria, frequency and vaginal discharge.  Musculoskeletal: Negative for arthralgias, back pain and myalgias.  Skin: Negative for rash and wound.  Allergic/Immunologic: Negative for immunocompromised state.  Neurological: Negative for weakness.  Hematological: Negative for adenopathy.  All other systems reviewed and are negative.   Physical Exam Updated Vital Signs BP 115/82   Pulse 83    Temp 98.9 F (37.2 C) (Oral)   Resp 18   Ht 5\' 1"  (1.549 m)   Wt 61.2 kg   LMP  (LMP Unknown)   SpO2 100%   BMI 25.51 kg/m   Physical Exam Vitals and nursing note reviewed.  Constitutional:      General: She is not in acute distress.    Appearance: She is well-developed. She is not diaphoretic.  HENT:     Head: Normocephalic and atraumatic.  Cardiovascular:     Rate and Rhythm: Normal rate and regular rhythm.     Heart sounds: Normal heart sounds.  Pulmonary:     Effort: Pulmonary effort is normal.  Abdominal:     Palpations: Abdomen is soft.     Tenderness: There is abdominal tenderness in the suprapubic area. There is no right CVA tenderness or left CVA tenderness.  Skin:    General: Skin is warm and dry.  Neurological:     Mental Status: She is alert and oriented to person, place, and time.  Psychiatric:        Behavior: Behavior normal.     ED Results / Procedures / Treatments   Labs (all labs ordered are listed, but only abnormal results are displayed) Labs Reviewed  URINALYSIS, ROUTINE W REFLEX MICROSCOPIC - Abnormal; Notable for the following components:      Result Value   APPearance HAZY (*)    All other components within normal limits  POC URINE PREG, ED    EKG None  Radiology No results found.  Procedures Procedures   Medications Ordered in ED Medications - No data to display  ED Course  I have reviewed the triage vital signs and the nursing notes.  Pertinent labs & imaging results that were available during my care of the patient were reviewed by me and considered in my medical decision making (see chart for details).  Clinical Course as of 02/27/21 1532  Sun Feb 27, 2021  5071 20 year old female with ongoing suprapubic discomfort, previously seen in the ED and negative for STIs.  Urine culture grew back 03/1999 colonies lactobacillus.  Patient completed all antibiotics however reports ongoing symptoms and unable to get in with her  gynecologist.  Denies new sexual partners, urinary symptoms or discharge at this time. On exam has been suprapubic tenderness.  Her pregnancy test is negative, urinalysis unremarkable No new partners to have concern for new STI, is open to referral to gynecology options in [place]. [LM]    Clinical Course User Index [LM] [place]   MDM Rules/Calculators/A&P                          Final Clinical Impression(s) / ED Diagnoses Final diagnoses:  Suprapubic abdominal pain    Rx /  DC Orders ED Discharge Orders    None       Alden Hipp 02/27/21 1532    Terrilee Files, MD 02/27/21 [phone]

## 2021-02-27 NOTE — Discharge Instructions (Addendum)
Consider these options for your care in the future:   Walk-ins for certain complaints available at:   [facility] [address]  [phone]  See hours at [email or web address]   [facility]  [address]  [phone]   [facility]  [address]  [phone]   You can make an appointment to see a GYN provider:   [facility]  [address]  [phone]   [facility] [place]  [address]  [phone]   [facility] [place]  [address]  [phone]   [facility] [place]  [address], [address]  [phone]   If you already have an established OB/GYN provider in the area you can make an appointment with them as well.  

## 2021-03-07 ENCOUNTER — Other Ambulatory Visit (HOSPITAL_COMMUNITY)
Admission: RE | Admit: 2021-03-07 | Discharge: 2021-03-07 | Disposition: A | Discharge status: Home / Self Care | Payer: Medicaid Other | Source: Ambulatory Visit | Attending: Obstetrics & Gynecology | Admitting: Obstetrics & Gynecology

## 2021-03-07 ENCOUNTER — Other Ambulatory Visit: Payer: Self-pay

## 2021-03-07 ENCOUNTER — Other Ambulatory Visit (INDEPENDENT_AMBULATORY_CARE_PROVIDER_SITE_OTHER): Payer: Medicaid Other

## 2021-03-07 DIAGNOSIS — Z113 Encounter for screening for infections with a predominantly sexual mode of transmission: Secondary | ICD-10-CM

## 2021-03-07 NOTE — Progress Notes (Signed)
   NURSE VISIT- STD Screen  SUBJECTIVE:  Kaitlyn Meyer is a 20 y.o. G0P0000 GYN patientfemale here for a vaginal swab for STD screen.  She reports the following symptoms: none." Wants to just make sure" Denies abnormal vaginal bleeding, significant pelvic pain, fever, or UTI symptoms.  OBJECTIVE:  LMP  (LMP Unknown)   Appears well, in no apparent distress  ASSESSMENT: Vaginal swab for STD screen  PLAN: Self-collected vaginal probe for Gonorrhea, Chlamydia, Trichomonas sent to lab Treatment: to be determined once results are received Follow-up as needed if symptoms persist/worsen, or new symptoms develop  Jobe Marker  03/07/2021 10:34 AM

## 2021-03-07 NOTE — Progress Notes (Signed)
Chart reviewed for nurse visit. Agree with plan of care.  Adline Potter, NP 03/07/2021 10:55 AM

## 2021-03-08 LAB — CERVICOVAGINAL ANCILLARY ONLY
Chlamydia: NEGATIVE
Comment: NEGATIVE
Comment: NEGATIVE
Comment: NORMAL
Neisseria Gonorrhea: NEGATIVE
Trichomonas: NEGATIVE

## 2021-03-24 ENCOUNTER — Ambulatory Visit (INDEPENDENT_AMBULATORY_CARE_PROVIDER_SITE_OTHER): Payer: Medicaid Other | Admitting: Women's Health

## 2021-03-24 ENCOUNTER — Encounter: Payer: Self-pay | Admitting: Women's Health

## 2021-03-24 ENCOUNTER — Other Ambulatory Visit (HOSPITAL_COMMUNITY)
Admission: RE | Admit: 2021-03-24 | Discharge: 2021-03-24 | Disposition: A | Discharge status: Home / Self Care | Payer: Medicaid Other | Source: Ambulatory Visit | Attending: Obstetrics & Gynecology | Admitting: Obstetrics & Gynecology

## 2021-03-24 ENCOUNTER — Other Ambulatory Visit: Payer: Self-pay

## 2021-03-24 VITALS — BP 112/71 | HR 83 | Ht 61.0 in | Wt 135.4 lb

## 2021-03-24 DIAGNOSIS — Z30011 Encounter for initial prescription of contraceptive pills: Secondary | ICD-10-CM

## 2021-03-24 DIAGNOSIS — R102 Pelvic and perineal pain: Secondary | ICD-10-CM | POA: Insufficient documentation

## 2021-03-24 MED ORDER — LO LOESTRIN FE 1 MG-10 MCG / 10 MCG PO TABS: 1.0000 | ORAL_TABLET | Freq: Every day | ORAL | 3 refills | Status: DC

## 2021-03-24 NOTE — Progress Notes (Signed)
GYN VISIT Patient name: Kaitlyn Meyer MRN [id number]  Date of birth: 07/03/2001 Chief Complaint:   Annual Exam ("something feels like it is wrong down there"/Pain)  History of Present Illness:   Kaitlyn Meyer is a 20 y.o. G0P0000 Caucasian female being seen today for report of pelvic cramping since Nov.  Happens randomly, low back pain at same time. Doesn't take meds to help, eventually just improves. Denies abnormal discharge, itching/odor/irritation.  Normal bm's, no constipation. Recent UTIs, last urine cx 3/11 ED visit normal, feels like she has gotten this 'under control'. Dx w/ PID at March ED visit by exam, treated empirically, gc/ct and wet prep neg. Hasn't had sex since. Periods are irregular, maybe every other month, normal length and flow, except for one period recently that was heavier. Last period started 5/16 and was lighter than her normal. Period cramps are more intense than these cramps- does have to take meds for those. Had been on COCs prior and didn't have any of these symptoms, but ran out.  Depression screen [facility] 2/9 03/24/2021 06/04/2018  Decreased Interest 2 3  Down, Depressed, Hopeless 2 3  PHQ - 2 Score 4 6  Altered sleeping 2 3  Tired, decreased energy 2 3  Change in appetite 2 3  Feeling bad or failure about yourself  2 3  Trouble concentrating 2 3  Moving slowly or fidgety/restless 2 3  Suicidal thoughts 0 3  PHQ-9 Score 16 27  Difficult doing work/chores - Extremely dIfficult    Patient's last menstrual period was 03/14/2021. The current method of family planning is abstinence.  Last pap <21yo. Results were: N/A Review of Systems:   Pertinent items are noted in HPI Denies fever/chills, dizziness, headaches, visual disturbances, fatigue, shortness of breath, chest pain, abdominal pain, vomiting, abnormal vaginal discharge/itching/odor/irritation, problems with periods, bowel movements, urination, or intercourse unless otherwise stated above.   Pertinent History Reviewed:  Reviewed past medical,surgical, social, obstetrical and family history.  Reviewed problem list, medications and allergies. Physical Assessment:   Vitals:   03/24/21 1207  BP: 112/71  Pulse: 83  Weight: 135 lb 6.4 oz (61.4 kg)  Height: 5\' 1"  (1.549 m)  Body mass index is 25.58 kg/m.       Physical Examination:   General appearance: alert, well appearing, and in no distress  Mental status: alert, oriented to person, place, and time  Skin: warm & dry   Cardiovascular: normal heart rate noted  Respiratory: normal respiratory effort, no distress  Abdomen: soft, non-tender   Pelvic: VULVA: normal appearing vulva with no masses, tenderness or lesions, VAGINA: normal appearing vagina with normal color and discharge, no lesions, CERVIX: normal appearing cervix without discharge or lesions, UTERUS: uterus is normal size, shape, consistency and +tenderness, ADNEXA: normal adnexa in size, and +tenderness bilaterally, no masses  Extremities: no edema   Chaperone: Latisha Cresenzo    No results found for this or any previous visit (from the past 24 hour(s)).  Assessment & Plan:  1) Pelvic cramping> CV swab sent, will get pelvic u/s. Get back on COCs  2) Contraception management> refilled COCs  Meds:  Meds ordered this encounter  Medications  . LO LOESTRIN FE 1 MG-10 MCG / 10 MCG tablet    Sig: Take 1 tablet by mouth daily.    Dispense:  90 tablet    Refill:  3    For co-pay card, pt to text "Lo Loestrin Fe " to [phone]  Co-pay card must be run in second position  "other coverage code 3"  if denied d/t PA, step edit, or insurance denial    Order Specific Question:   Supervising Provider    Answer:   Duane Lope H [[id number]]    Orders Placed This Encounter  Procedures  . US PELVIS (TRANSABDOMINAL ONLY)  . US PELVIS TRANSVAGINAL NON-OB (TV ONLY)    Return in about 3 months (around 06/24/2021) for Pap & physical, med f/u.  Cheral Marker CNM,  [facility] 03/24/2021 12:46 PM

## 2021-03-25 LAB — CERVICOVAGINAL ANCILLARY ONLY
Bacterial Vaginitis (gardnerella): POSITIVE — AB
Candida Glabrata: NEGATIVE
Candida Vaginitis: NEGATIVE
Chlamydia: NEGATIVE
Comment: NEGATIVE
Comment: NEGATIVE
Comment: NEGATIVE
Comment: NEGATIVE
Comment: NEGATIVE
Comment: NORMAL
Neisseria Gonorrhea: NEGATIVE
Trichomonas: NEGATIVE

## 2021-03-25 MED ORDER — METRONIDAZOLE 500 MG PO TABS: 500.0000 mg | ORAL_TABLET | Freq: Two times a day (BID) | ORAL | 0 refills | Status: DC

## 2021-03-25 NOTE — Addendum Note (Signed)
Addended by: Cheral Marker on: 03/25/2021 02:20 PM   Modules accepted: Orders

## 2021-04-04 ENCOUNTER — Ambulatory Visit (HOSPITAL_COMMUNITY)
Admission: RE | Admit: 2021-04-04 | Discharge: 2021-04-04 | Disposition: A | Discharge status: Home / Self Care | Payer: Medicaid Other | Source: Ambulatory Visit | Attending: Women's Health | Admitting: Women's Health

## 2021-04-04 ENCOUNTER — Other Ambulatory Visit: Payer: Self-pay

## 2021-04-04 DIAGNOSIS — R102 Pelvic and perineal pain: Secondary | ICD-10-CM | POA: Insufficient documentation

## 2021-05-04 ENCOUNTER — Other Ambulatory Visit: Payer: Self-pay

## 2021-05-04 ENCOUNTER — Ambulatory Visit
Admission: EM | Admit: 2021-05-04 | Discharge: 2021-05-04 | Disposition: A | Discharge status: Home / Self Care | Payer: Medicaid Other | Attending: Family Medicine | Admitting: Family Medicine

## 2021-05-04 DIAGNOSIS — R509 Fever, unspecified: Secondary | ICD-10-CM | POA: Diagnosis not present

## 2021-05-04 NOTE — ED Provider Notes (Signed)
[facility]   [id number] 05/04/21 Arrival Time: 0846  ASSESSMENT & PLAN:  1. Fever, unspecified fever cause    Discussed typical duration of viral illnesses. Confirmatory COVID-19 testing sent. OTC symptom care as needed.     Follow-up Information     Luking, Vilinda Blanks, MD.   Specialty: Family Medicine Why: As needed. Contact information: [address] [place] [address] [phone]                 Reviewed expectations re: course of current medical issues. Questions answered. Outlined signs and symptoms indicating need for more acute intervention. Understanding verbalized. After Visit Summary given.   SUBJECTIVE: History from: patient. Kaitlyn Meyer is a 20 y.o. female who presents with worries regarding COVID-19. Known COVID-19 contact: yes. Recent travel: none. Reports: subj fever. Positive home COVID test. Needs confirmation test for work. Denies: difficulty breathing. Normal PO intake without n/v/d.   OBJECTIVE:  Vitals:   05/04/21 0940  BP: 105/78  Pulse: 93  Resp: 20  Temp: 99.2 F (37.3 C)  SpO2: 98%    General appearance: alert; no distress Eyes: PERRLA; EOMI; conjunctiva normal HENT: [place]; AT; with nasal congestion Neck: supple  Lungs: speaks full sentences without difficulty; unlabored Extremities: no edema Skin: warm and dry Neurologic: normal gait Psychological: alert and cooperative; normal mood and affect  Labs: Labs Reviewed  NOVEL CORONAVIRUS, NAA    No Known Allergies  History reviewed. No pertinent past medical history. Social History   Socioeconomic History   Marital status: Single    Spouse name: Not on file   Number of children: Not on file   Years of education: Not on file   Highest education level: Not on file  Occupational History   Not on file  Tobacco Use   Smoking status: Light Smoker    Packs/day: 0.25    Pack years: 0.00    Types: Cigarettes   Smokeless tobacco: Never   Vaping Use   Vaping Use: Never used  Substance and Sexual Activity   Alcohol use: No   Drug use: No   Sexual activity: Not Currently    Birth control/protection: None  Other Topics Concern   Not on file  Social History Narrative   Not on file   Social Determinants of Health   Financial Resource Strain: Medium Risk   Difficulty of Paying Living Expenses: Somewhat hard  Food Insecurity: No Food Insecurity   Worried About Programme researcher, broadcasting/film/video in the Last Year: Never true   Ran Out of Food in the Last Year: Never true  Transportation Needs: No Transportation Needs   Lack of Transportation (Medical): No   Lack of Transportation (Non-Medical): No  Physical Activity: Sufficiently Active   Days of Exercise per Week: 7 days   Minutes of Exercise per Session: 60 min  Stress: Stress Concern Present   Feeling of Stress : Very much  Social Connections: Socially Isolated   Frequency of Communication with Friends and Family: More than three times a week   Frequency of Social Gatherings with Friends and Family: Once a week   Attends Religious Services: Never   Database administrator or Organizations: No   Attends Engineer, structural: Never   Marital Status: Never married  Catering manager Violence: Not At Risk   Fear of Current or Ex-Partner: No   Emotionally Abused: No   Physically Abused: No   Sexually Abused: No   Family History  Problem  Relation Age of Onset   Healthy Mother    Diabetes Mother    Hypertension Mother    Hyperlipidemia Mother    Healthy Father    Diabetes Father    Past Surgical History:  Procedure Laterality Date   NO PAST SURGERIES       Mardella Layman, MD 05/04/21 1059

## 2021-05-04 NOTE — Discharge Instructions (Addendum)
You have been tested for COVID-19 today. °If your test returns positive, you will receive a phone call from [facility] regarding your results. °Negative test results are not called. °Both positive and negative results area always visible on MyChart. °If you do not have a MyChart account, sign up instructions are provided in your discharge papers. °Please do not hesitate to contact us should you have questions or concerns. ° °

## 2021-05-04 NOTE — ED Triage Notes (Signed)
Pt tested positive for covid at home, has cough fever and feeling unwell, needs confirmation test for work

## 2021-05-05 LAB — NOVEL CORONAVIRUS, NAA: SARS-CoV-2, NAA: DETECTED — AB

## 2021-05-05 LAB — SARS-COV-2, NAA 2 DAY TAT

## 2021-06-01 ENCOUNTER — Other Ambulatory Visit (INDEPENDENT_AMBULATORY_CARE_PROVIDER_SITE_OTHER): Payer: Medicaid Other | Admitting: *Deleted

## 2021-06-01 ENCOUNTER — Other Ambulatory Visit: Payer: Self-pay

## 2021-06-01 ENCOUNTER — Other Ambulatory Visit (HOSPITAL_COMMUNITY)
Admission: RE | Admit: 2021-06-01 | Discharge: 2021-06-01 | Disposition: A | Discharge status: Home / Self Care | Payer: Medicaid Other | Source: Ambulatory Visit | Attending: Obstetrics & Gynecology | Admitting: Obstetrics & Gynecology

## 2021-06-01 DIAGNOSIS — N898 Other specified noninflammatory disorders of vagina: Secondary | ICD-10-CM

## 2021-06-01 MED ORDER — LO LOESTRIN FE 1 MG-10 MCG / 10 MCG PO TABS: 1.0000 | ORAL_TABLET | Freq: Every day | ORAL | 3 refills | Status: DC

## 2021-06-01 NOTE — Addendum Note (Signed)
Addended by: Cyril Mourning A on: 06/01/2021 04:21 PM   Modules accepted: Orders

## 2021-06-01 NOTE — Progress Notes (Signed)
Chart reviewed for nurse visit. Agree with plan of care. Refilled Lo loestrin Adline Potter, NP 06/01/2021 4:21 PM

## 2021-06-01 NOTE — Progress Notes (Signed)
   NURSE VISIT- VAGINITIS/STD  SUBJECTIVE:  Kaitlyn Meyer is a 20 y.o. G0P0000 GYN patientfemale here for a vaginal swab for vaginitis screening, STD screen.  She reports the following symptoms:  vaginal discharge and odor  for few days. Denies abnormal vaginal bleeding, significant pelvic pain, fever, or UTI symptoms.  OBJECTIVE:  There were no vitals taken for this visit.  Appears well, in no apparent distress  ASSESSMENT: Vaginal swab for vaginitis screening & STD screen  PLAN: Self-collected vaginal probe for Gonorrhea, Chlamydia, Trichomonas, Bacterial Vaginosis, Yeast sent to lab Treatment: to be determined once results are received Follow-up as needed if symptoms persist/worsen, or new symptoms develop  [name]  06/01/2021 2:52 PM

## 2021-06-03 ENCOUNTER — Other Ambulatory Visit: Payer: Self-pay | Admitting: Women's Health

## 2021-06-03 LAB — CERVICOVAGINAL ANCILLARY ONLY
Bacterial Vaginitis (gardnerella): POSITIVE — AB
Candida Glabrata: NEGATIVE
Candida Vaginitis: NEGATIVE
Chlamydia: NEGATIVE
Comment: NEGATIVE
Comment: NEGATIVE
Comment: NEGATIVE
Comment: NEGATIVE
Comment: NEGATIVE
Comment: NORMAL
Neisseria Gonorrhea: NEGATIVE
Trichomonas: NEGATIVE

## 2021-06-03 MED ORDER — METRONIDAZOLE 500 MG PO TABS: 500.0000 mg | ORAL_TABLET | Freq: Two times a day (BID) | ORAL | 0 refills | Status: DC

## 2021-06-27 ENCOUNTER — Other Ambulatory Visit: Payer: Self-pay

## 2021-06-27 ENCOUNTER — Other Ambulatory Visit (HOSPITAL_COMMUNITY)
Admission: RE | Admit: 2021-06-27 | Discharge: 2021-06-27 | Disposition: A | Discharge status: Home / Self Care | Payer: Medicaid Other | Source: Ambulatory Visit | Attending: Obstetrics & Gynecology | Admitting: Obstetrics & Gynecology

## 2021-06-27 ENCOUNTER — Ambulatory Visit (INDEPENDENT_AMBULATORY_CARE_PROVIDER_SITE_OTHER): Payer: Medicaid Other | Admitting: Women's Health

## 2021-06-27 ENCOUNTER — Encounter: Payer: Self-pay | Admitting: Women's Health

## 2021-06-27 VITALS — BP 121/82 | HR 97 | Ht 61.0 in | Wt 143.5 lb

## 2021-06-27 DIAGNOSIS — Z113 Encounter for screening for infections with a predominantly sexual mode of transmission: Secondary | ICD-10-CM | POA: Diagnosis not present

## 2021-06-27 DIAGNOSIS — E559 Vitamin D deficiency, unspecified: Secondary | ICD-10-CM

## 2021-06-27 DIAGNOSIS — Z803 Family history of malignant neoplasm of breast: Secondary | ICD-10-CM | POA: Insufficient documentation

## 2021-06-27 DIAGNOSIS — Z01419 Encounter for gynecological examination (general) (routine) without abnormal findings: Secondary | ICD-10-CM

## 2021-06-27 DIAGNOSIS — Z0000 Encounter for general adult medical examination without abnormal findings: Secondary | ICD-10-CM

## 2021-06-27 DIAGNOSIS — R5383 Other fatigue: Secondary | ICD-10-CM
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
## 2021-06-28 DIAGNOSIS — E559 Vitamin D deficiency, unspecified: Secondary | ICD-10-CM | POA: Insufficient documentation

## 2021-06-28 LAB — CYTOLOGY - PAP
Chlamydia: NEGATIVE
Comment: NEGATIVE
Comment: NORMAL
Diagnosis: NEGATIVE
Neisseria Gonorrhea: NEGATIVE

## 2021-06-28 LAB — CBC
Hematocrit: 39.7 % (ref 34.0–46.6)
Hemoglobin: 12.9 g/dL (ref 11.1–15.9)
MCH: 29.3 pg (ref 26.6–33.0)
MCHC: 32.5 g/dL (ref 31.5–35.7)
MCV: 90 fL (ref 79–97)
Platelets: 257 10*3/uL (ref 150–450)
RBC: 4.41 x10E6/uL (ref 3.77–5.28)
RDW: 13.3 % (ref 11.7–15.4)
WBC: 7.8 10*3/uL (ref 3.4–10.8)

## 2021-06-28 LAB — VITAMIN D 25 HYDROXY (VIT D DEFICIENCY, FRACTURES): Vit D, 25-Hydroxy: 10.7 ng/mL — ABNORMAL LOW (ref 30.0–100.0)

## 2021-06-28 LAB — RPR: RPR Ser Ql: NONREACTIVE

## 2021-06-28 LAB — HIV ANTIBODY (ROUTINE TESTING W REFLEX): HIV Screen 4th Generation wRfx: NONREACTIVE

## 2021-06-28 LAB — TSH: TSH: 1.71 u[IU]/mL (ref 0.450–4.500)

## 2021-06-28 NOTE — Addendum Note (Signed)
Addended by: Cheral Marker on: 06/28/2021 12:25 PM   Modules accepted: Orders

## 2021-08-05 ENCOUNTER — Ambulatory Visit
Admission: EM | Admit: 2021-08-05 | Discharge: 2021-08-05 | Disposition: A | Discharge status: Home / Self Care | Payer: Medicaid Other | Attending: Family Medicine | Admitting: Family Medicine

## 2021-08-05 ENCOUNTER — Other Ambulatory Visit: Payer: Self-pay

## 2021-08-05 ENCOUNTER — Encounter: Payer: Self-pay | Admitting: Emergency Medicine

## 2021-08-05 DIAGNOSIS — N390 Urinary tract infection, site not specified: Secondary | ICD-10-CM

## 2021-08-05 DIAGNOSIS — R051 Acute cough: Secondary | ICD-10-CM

## 2021-08-05 LAB — POCT URINALYSIS DIP (MANUAL ENTRY)
Bilirubin, UA: NEGATIVE
Glucose, UA: NEGATIVE mg/dL
Ketones, POC UA: NEGATIVE mg/dL
Nitrite, UA: NEGATIVE
Spec Grav, UA: >=1.03 — AB (ref 1.010–1.025)
Urobilinogen, UA: 0.2 E.U./dL
pH, UA: 6.5 (ref 5.0–8.0)

## 2021-08-05 LAB — POCT URINE PREGNANCY: Preg Test, Ur: NEGATIVE

## 2021-08-05 MED ORDER — CEPHALEXIN 500 MG PO CAPS: 500.0000 mg | ORAL_CAPSULE | Freq: Two times a day (BID) | ORAL | 0 refills | Status: DC

## 2021-08-05 MED ORDER — PROMETHAZINE-DM 6.25-15 MG/5ML PO SYRP: 5.0000 mL | ORAL_SOLUTION | Freq: Four times a day (QID) | ORAL | 0 refills | Status: DC | PRN

## 2021-08-05 NOTE — ED Triage Notes (Signed)
Pt presents today with c/o of dysuria and frequent urination x 3 days. She also c/o of cough (productive) x 1.5 weeks. Denies fever.

## 2021-08-05 NOTE — ED Provider Notes (Signed)
[facility]    CSN: [id number] Arrival date & time: 08/05/21  1102      History   Chief Complaint Chief Complaint  Patient presents with   Dysuria   Cough    HPI Kaitlyn Meyer is a 20 y.o. female.   Patient presenting today with multiple complaints.  She states that she has had a worsening productive cough for about 1.5 weeks that seems to be worse when she lays down for bed.  Denies wheezing, fever, chest pain, shortness of breath, congestion or sore throat associated.  Not tried anything besides cough drops which provided mild short-term relief.  Denies any history of seasonal allergies, asthma or sick contacts.  She is also having 3 days of off-and-on dysuria, urinary frequency, pressure.  Taking Azo with mild temporary relief.  Does have a history of urinary tract infections that have felt similar.  LMP 07/06/2021, consistent with oral contraceptives.  History reviewed. No pertinent past medical history.  Patient Active Problem List   Diagnosis Date Noted   Vitamin D deficiency 06/28/2021   Family history of breast cancer 06/27/2021   Dysmenorrhea 05/26/2020   Menorrhagia with irregular cycle 05/26/2020   BV (bacterial vaginosis) 05/26/2020    Past Surgical History:  Procedure Laterality Date   NO PAST SURGERIES      OB History     Gravida  0   Para  0   Term  0   Preterm  0   AB  0   Living  0      SAB  0   IAB  0   Ectopic  0   Multiple  0   Live Births  0            Home Medications    Prior to Admission medications   Medication Sig Start Date End Date Taking? Authorizing Provider  cephALEXin (KEFLEX) 500 MG capsule Take 1 capsule (500 mg total) by mouth 2 (two) times daily. 08/05/21  Yes Particia Nearing, PA-C  promethazine-dextromethorphan (PROMETHAZINE-DM) 6.25-15 MG/5ML syrup Take 5 mLs by mouth 4 (four) times daily as needed for cough. 08/05/21  Yes Particia Nearing, PA-C  LO LOESTRIN FE 1 MG-10 MCG / 10  MCG tablet Take 1 tablet by mouth daily. 06/01/21   Adline Potter, NP  fluticasone (FLONASE) 50 MCG/ACT nasal spray Place 2 sprays into both nostrils daily. 09/12/19 01/03/20  Domenick Gong, MD  traZODone (DESYREL) 50 MG tablet TAKE 1 2 TABLET 2 TIMES DAILY AS NEEDED FOR ANXIETY AND 1 2 BY MOUTH AT BEDTIME FOR INSOMNIA AS NEEDED 09/09/19 01/03/20  [provider]    Family History Family History  Problem Relation Age of Onset   Healthy Mother    Diabetes Mother    Hypertension Mother    Hyperlipidemia Mother    Healthy Father    Diabetes Father     Social History Social History   Tobacco Use   Smoking status: Light Smoker    Packs/day: 0.25    Types: Cigarettes   Smokeless tobacco: Never  Vaping Use   Vaping Use: Never used  Substance Use Topics   Alcohol use: No   Drug use: No     Allergies   Patient has no known allergies.   Review of Systems Review of Systems Per HPI  Physical Exam Triage Vital Signs ED Triage Vitals  Enc Vitals Group     BP 08/05/21 1238 108/65     Pulse Rate  08/05/21 1238 (!) 106     Resp 08/05/21 1238 18     Temp 08/05/21 1238 98.8 F (37.1 C)     Temp Source 08/05/21 1238 Oral     SpO2 08/05/21 1238 95 %     Weight --      Height --      Head Circumference --      Peak Flow --      Pain Score 08/05/21 1239 0     Pain Loc --      Pain Edu? --      Excl. in GC? --    No data found.  Updated Vital Signs BP 108/65 (BP Location: Right Arm)   Pulse (!) 106   Temp 98.8 F (37.1 C) (Oral)   Resp 18   LMP 07/06/2021 (Exact Date)   SpO2 95%   Visual Acuity Right Eye Distance:   Left Eye Distance:   Bilateral Distance:    Right Eye Near:   Left Eye Near:    Bilateral Near:     Physical Exam Vitals and nursing note reviewed.  Constitutional:      Appearance: Normal appearance. She is not ill-appearing.  HENT:     Head: Atraumatic.     Nose: Nose normal.     Mouth/Throat:     Mouth: Mucous membranes are  moist.     Pharynx: No posterior oropharyngeal erythema.  Eyes:     Extraocular Movements: Extraocular movements intact.     Conjunctiva/sclera: Conjunctivae normal.  Cardiovascular:     Rate and Rhythm: Normal rate and regular rhythm.     Heart sounds: Normal heart sounds.  Pulmonary:     Effort: Pulmonary effort is normal. No respiratory distress.     Breath sounds: Normal breath sounds. No wheezing or rales.  Abdominal:     General: Bowel sounds are normal. There is no distension.     Palpations: Abdomen is soft.     Tenderness: There is no abdominal tenderness. There is no right CVA tenderness, left CVA tenderness or guarding.  Musculoskeletal:        General: Normal range of motion.     Cervical back: Normal range of motion and neck supple.  Skin:    General: Skin is warm and dry.  Neurological:     Mental Status: She is alert and oriented to person, place, and time.  Psychiatric:        Mood and Affect: Mood normal.        Thought Content: Thought content normal.        Judgment: Judgment normal.   UC Treatments / Results  Labs (all labs ordered are listed, but only abnormal results are displayed) Labs Reviewed  POCT URINALYSIS DIP (MANUAL ENTRY) - Abnormal; Notable for the following components:      Result Value   Spec Grav, UA >=1.030 (*)    Blood, UA small (*)    Protein Ur, POC trace (*)    Leukocytes, UA Small (1+) (*)    All other components within normal limits  URINE CULTURE  POCT URINE PREGNANCY    EKG   Radiology No results found.  Procedures Procedures (including critical care time)  Medications Ordered in UC Medications - No data to display  Initial Impression / Assessment and Plan / UC Course  I have reviewed the triage vital signs and the nursing notes.  Pertinent labs & imaging results that were available during my care of the patient were  reviewed by me and considered in my medical decision making (see chart for details).     Exam  reassuring today, cough possibly related to postnasal drip or recent illness.  Treat with Phenergan DM, Mucinex, over-the-counter supportive medications and home care.  Urinalysis today showing possible urinary tract infection.  Urine culture pending, treat with Keflex in the meantime.  Return for acutely worsening symptoms.  Final Clinical Impressions(s) / UC Diagnoses   Final diagnoses:  Acute lower UTI  Acute cough   Discharge Instructions   None    ED Prescriptions     Medication Sig Dispense Auth. Provider   promethazine-dextromethorphan (PROMETHAZINE-DM) 6.25-15 MG/5ML syrup Take 5 mLs by mouth 4 (four) times daily as needed for cough. 100 mL Particia Nearing, PA-C   cephALEXin (KEFLEX) 500 MG capsule Take 1 capsule (500 mg total) by mouth 2 (two) times daily. 10 capsule Particia Nearing, [place]      PDMP not reviewed this encounter.   Particia Nearing, [place] 08/05/21 1324

## 2021-08-07 LAB — URINE CULTURE: Culture: <10000 — AB

## 2021-08-09 ENCOUNTER — Ambulatory Visit: Payer: Medicaid Other | Admitting: Family Medicine

## 2021-08-09 ENCOUNTER — Encounter: Payer: Self-pay | Admitting: Family Medicine

## 2021-08-15 ENCOUNTER — Other Ambulatory Visit: Payer: Medicaid Other

## 2021-08-29 IMAGING — US US PELVIS COMPLETE
1 series · 14 of 25 positions shown · non-contrast
Comparison: 02/26/2018

CLINICAL DATA: Pelvic cramping since [REDACTED], LMP 04/03/2021

EXAM:
TRANSABDOMINAL AND TRANSVAGINAL ULTRASOUND OF PELVIS
TECHNIQUE: Both transabdominal and transvaginal ultrasound examinations of the
pelvis were performed. Transabdominal technique was performed for
global imaging of the pelvis including uterus, ovaries, adnexal
regions, and pelvic cul-de-sac. It was necessary to proceed with
endovaginal exam following the transabdominal exam to visualize the
endometrium and ovaries.

[Series 1: us pelvis (transabdominal only) · 14 of 116 slices shown]
[im 1/116]
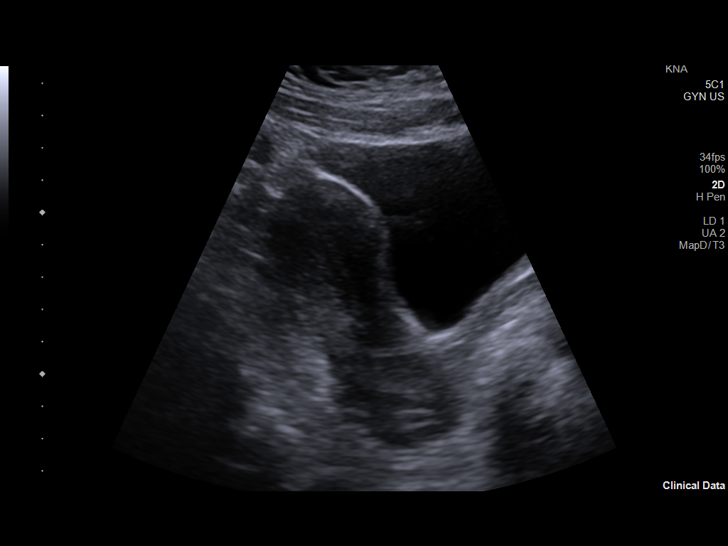
[im 10/116]
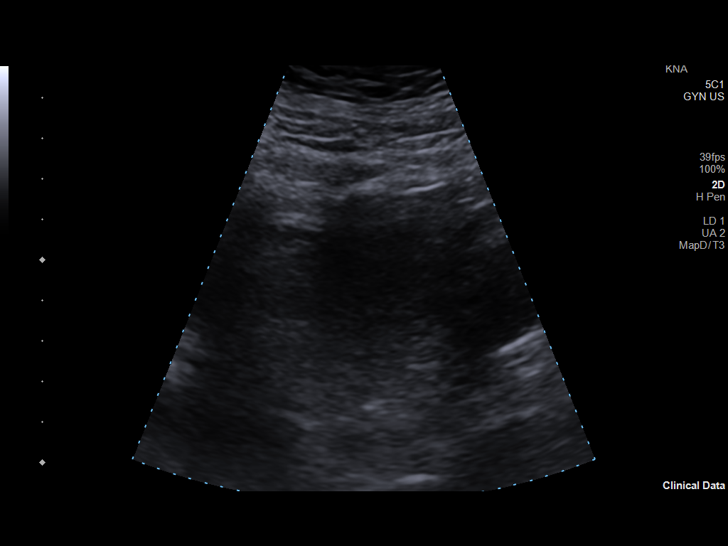
[im 20/116]
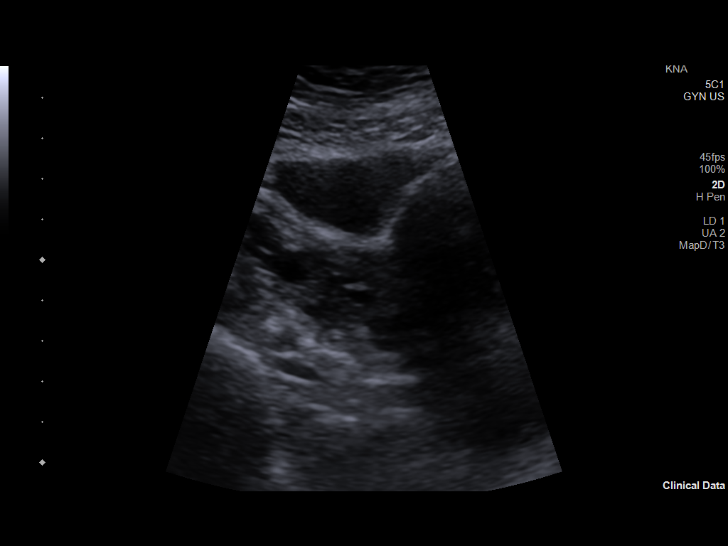
[im 29/116]
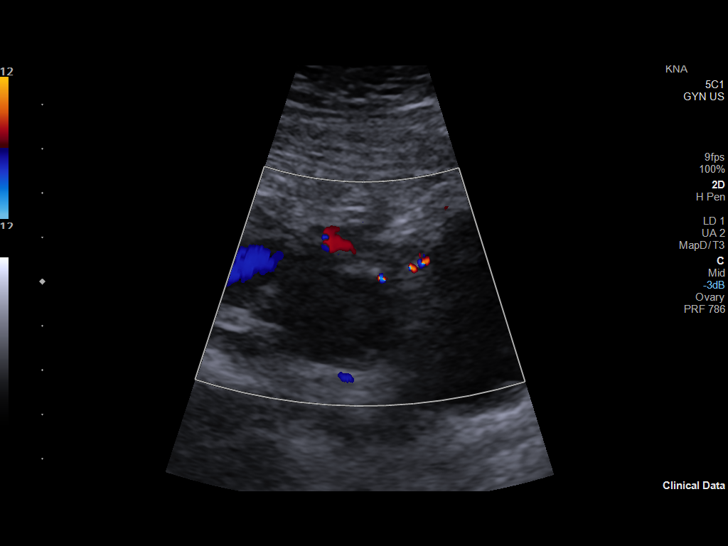
[im 39/116]
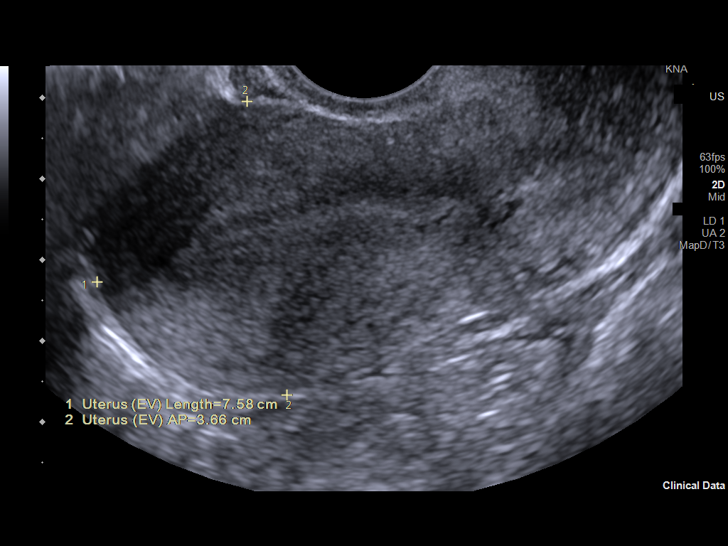
[im 44/116]
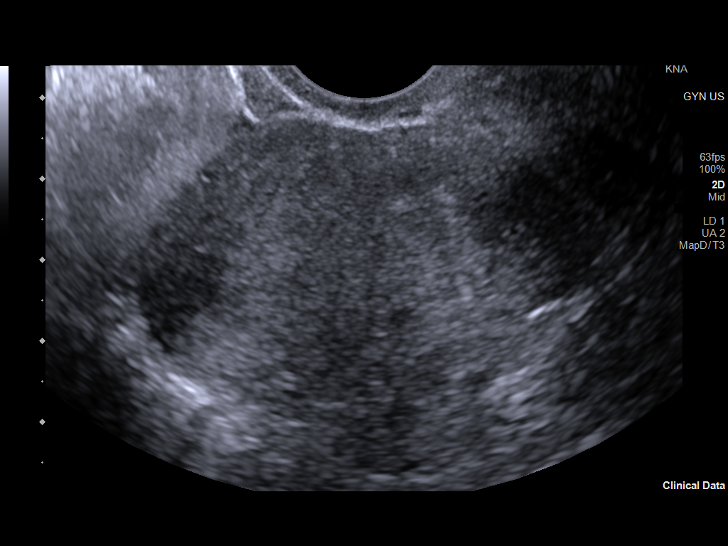
[im 53/116]
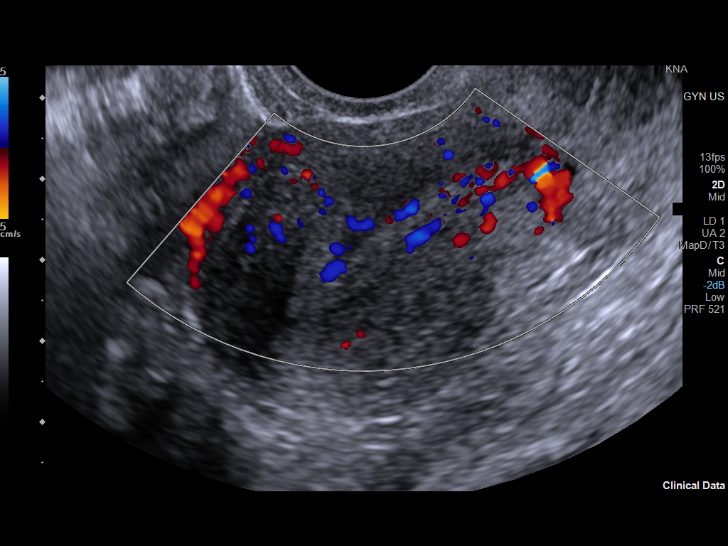
[im 63/116]
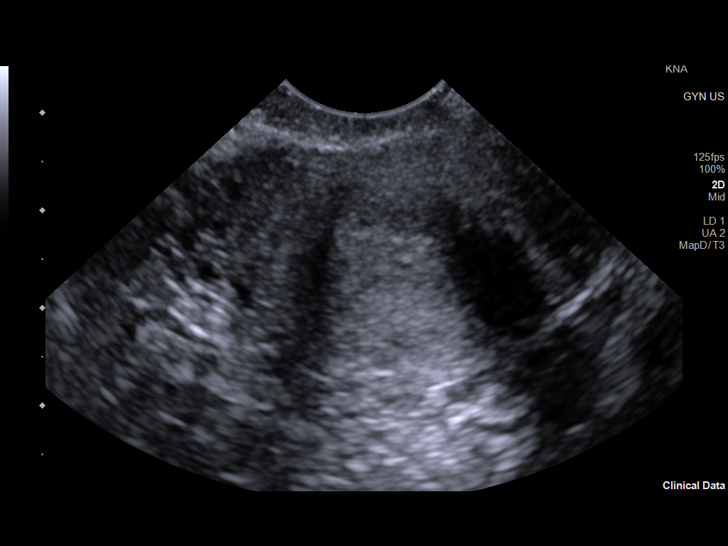
[im 72/116]
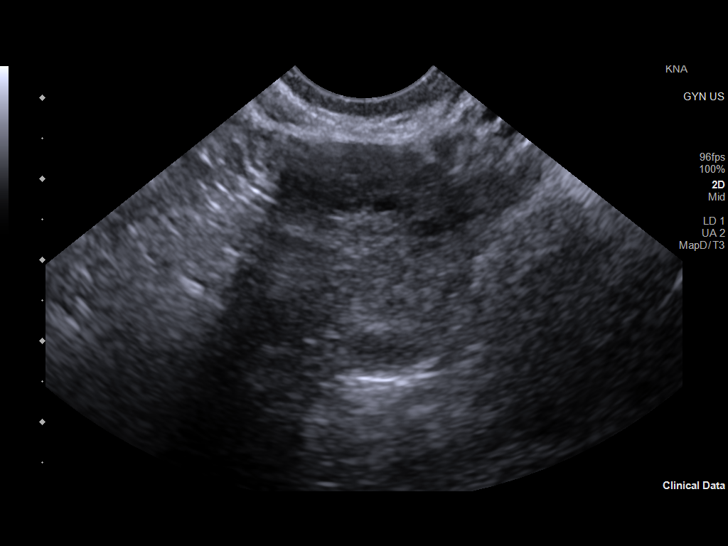
[im 77/116]
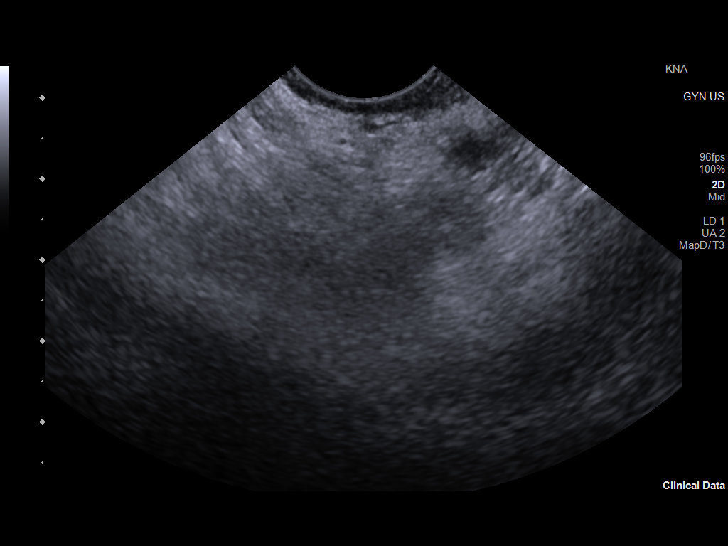
[im 87/116]
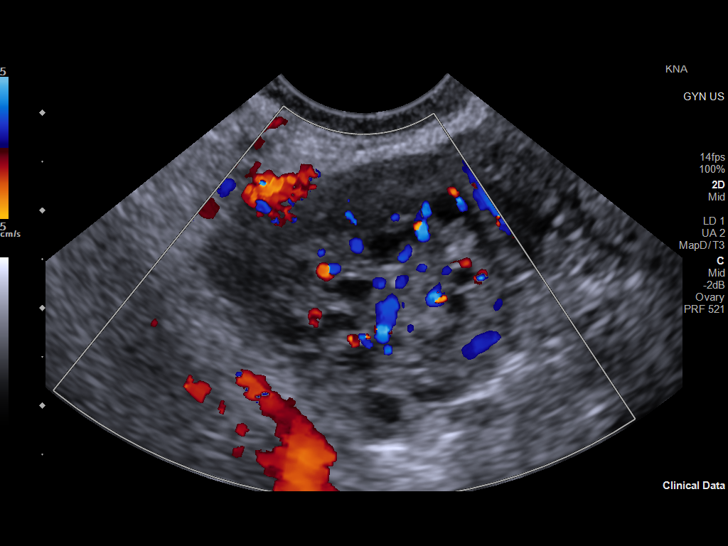
[im 96/116]
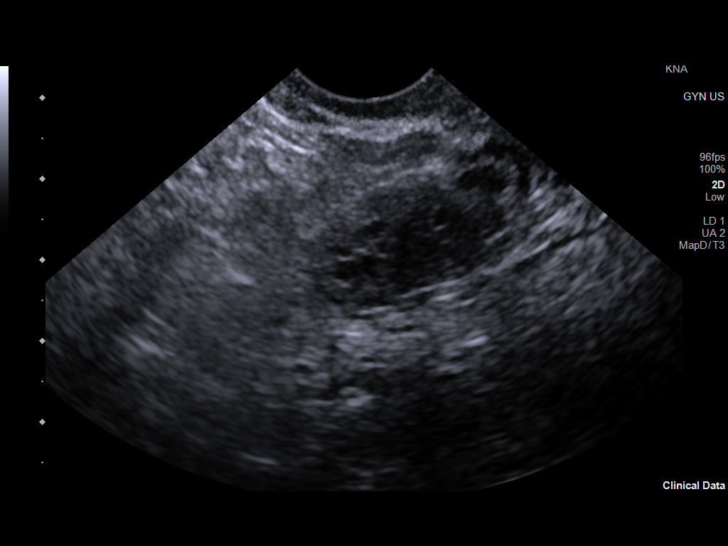
[im 106/116]
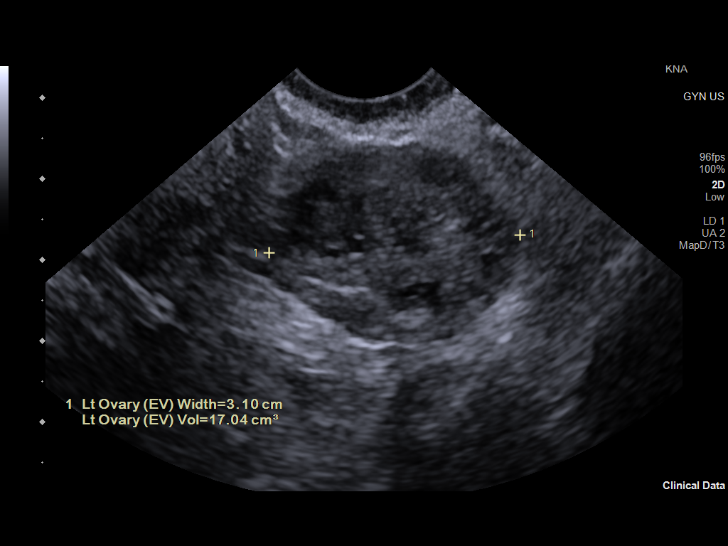
[im 116/116]
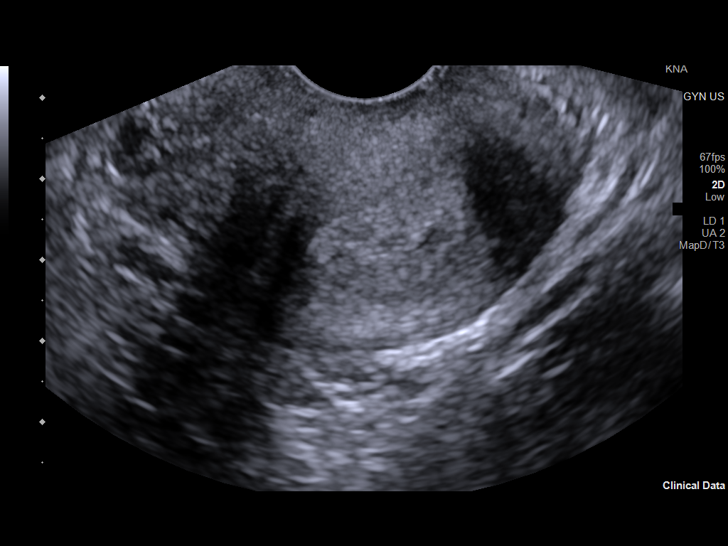

[14 of 25 positions shown; findings below may reference images not displayed]

FINDINGS: Uterus

Measurements: 7.6 x 3.7 x 4.5 cm = volume: 65 mL. Anteverted. Normal
morphology without mass

Endometrium

Thickness: 9 mm.  No endometrial fluid or focal abnormality

Right ovary

Measurements: 2.9 x 2.6 x 2.8 cm = volume: 11.2 mL. Normal
morphology without mass

Left ovary

Measurements: 3.3 x 3.2 x 3.1 cm = volume: 17.0 mL. Normal
morphology without mass

Other findings

No free pelvic fluid.  No adnexal masses.
IMPRESSION: Normal exam.

## 2021-08-29 IMAGING — US US TRANSVAGINAL NON-OB
1 series · 14 of 25 positions shown · non-contrast
Comparison: 02/26/2018

CLINICAL DATA: Pelvic cramping since [REDACTED], LMP 04/03/2021

EXAM:
TRANSABDOMINAL AND TRANSVAGINAL ULTRASOUND OF PELVIS
TECHNIQUE: Both transabdominal and transvaginal ultrasound examinations of the
pelvis were performed. Transabdominal technique was performed for
global imaging of the pelvis including uterus, ovaries, adnexal
regions, and pelvic cul-de-sac. It was necessary to proceed with
endovaginal exam following the transabdominal exam to visualize the
endometrium and ovaries.

[Series 1: us pelvis (transabdominal only) · 14 of 116 slices shown]
[im 1/116]
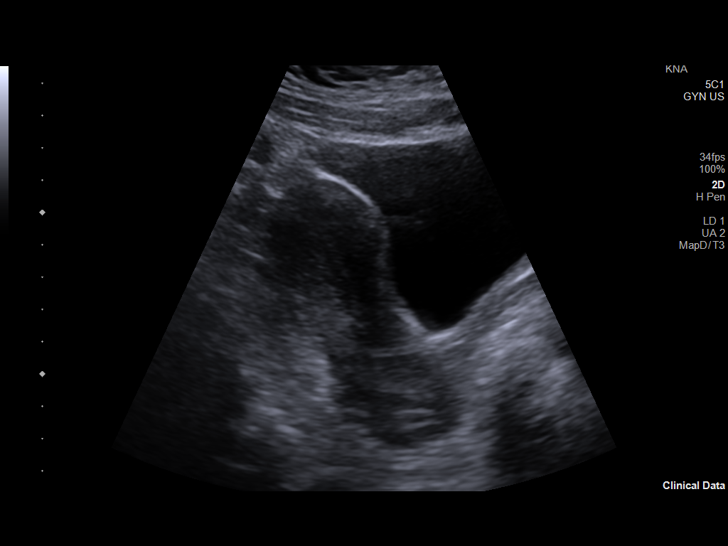
[im 10/116]
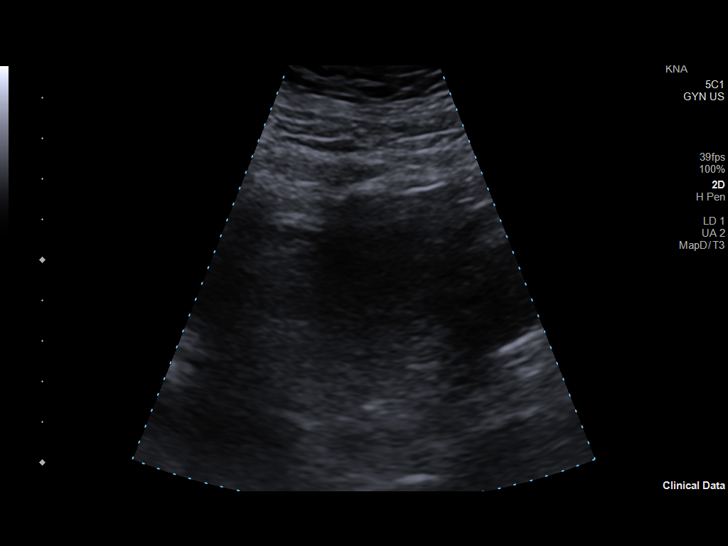
[im 20/116]
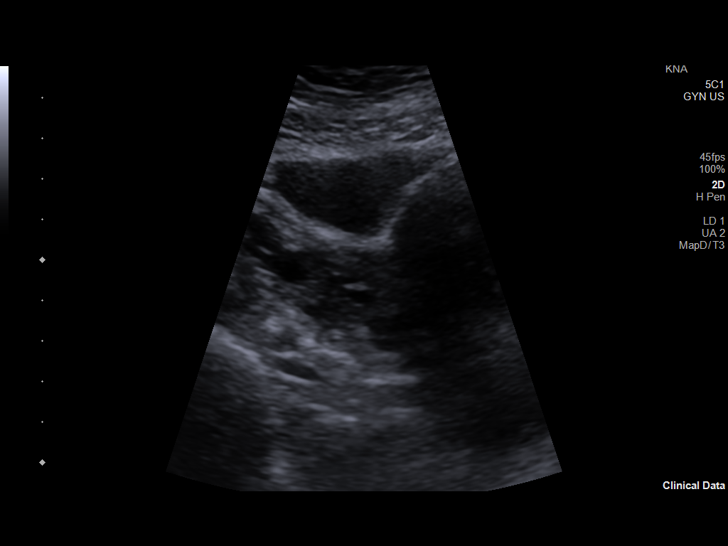
[im 29/116]
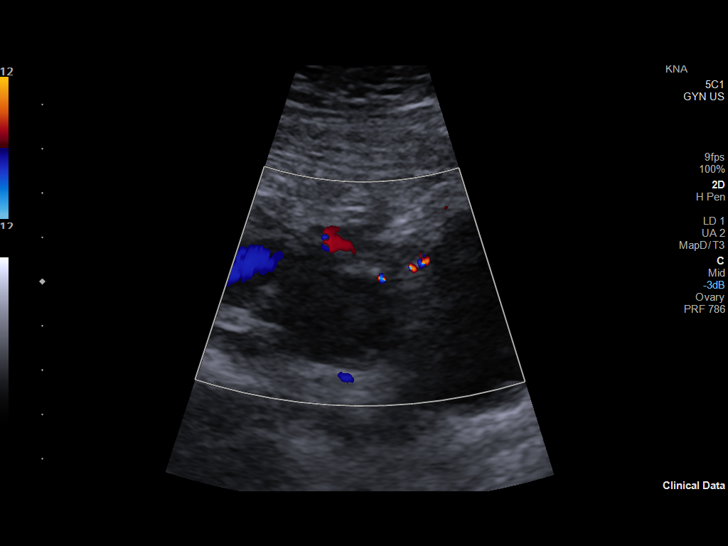
[im 39/116]
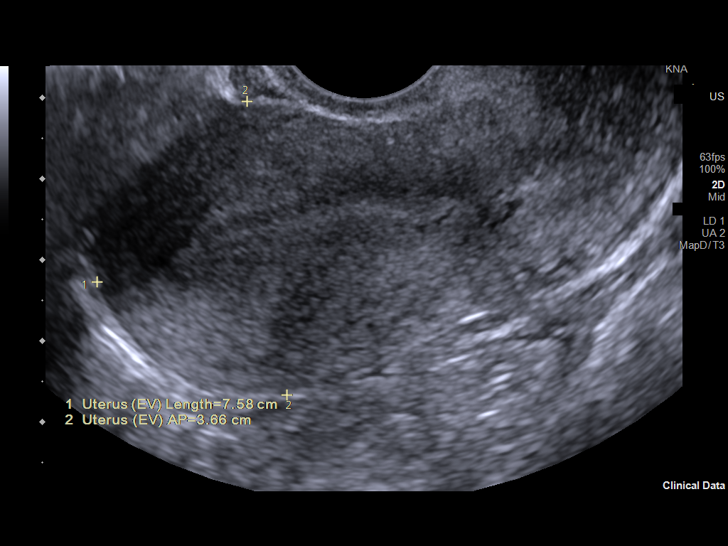
[im 44/116]
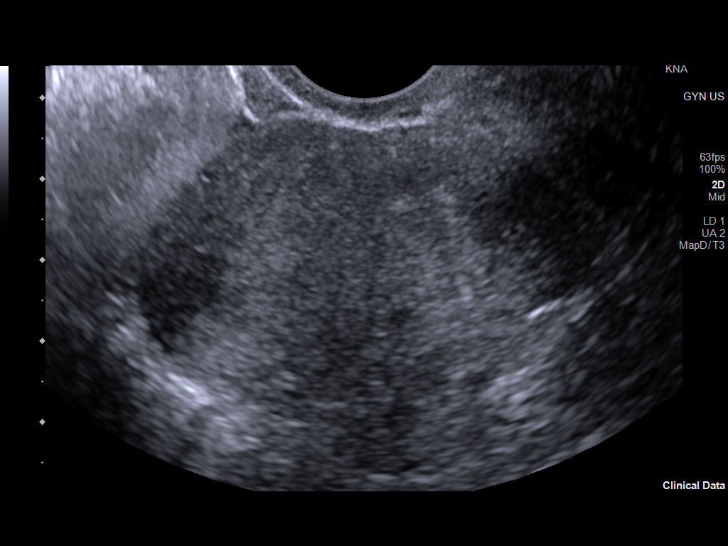
[im 53/116]
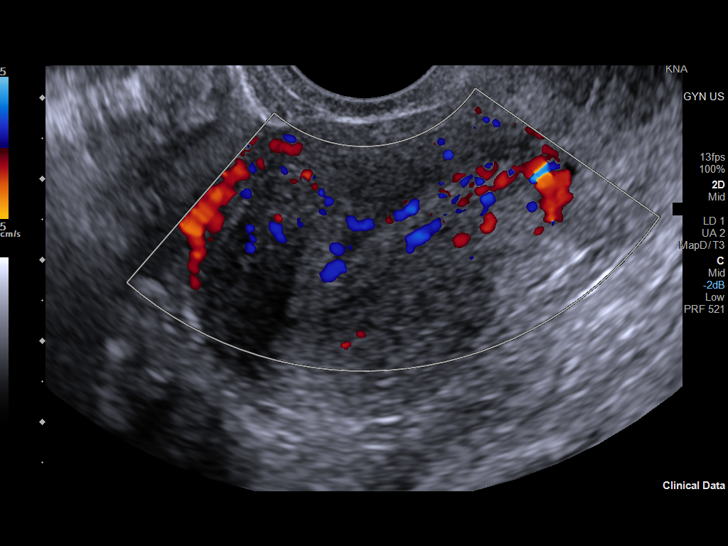
[im 63/116]
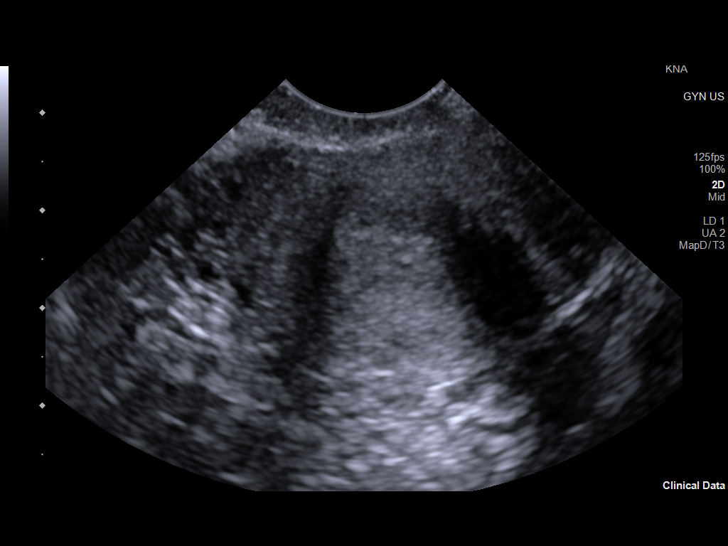
[im 72/116]
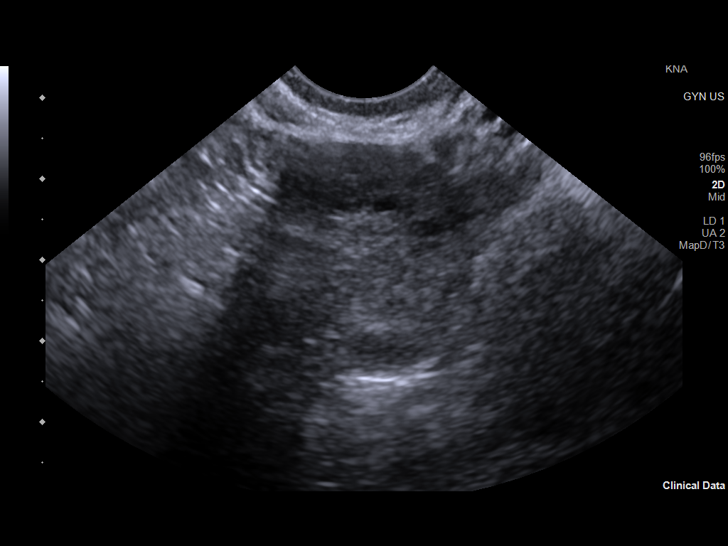
[im 77/116]
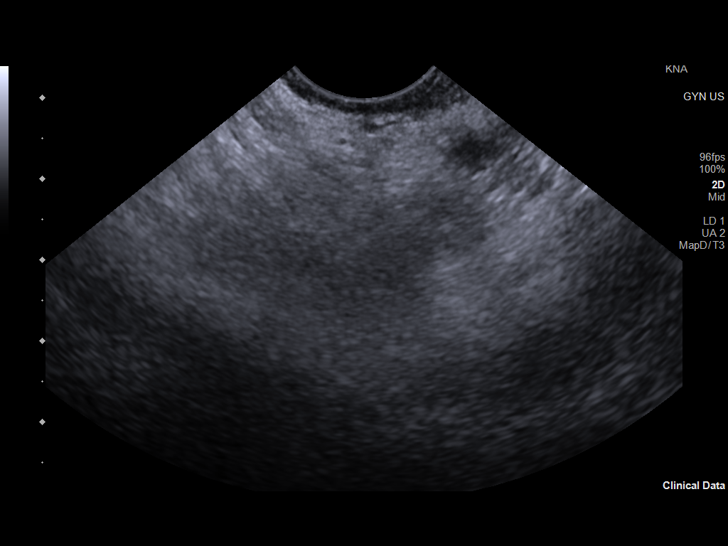
[im 87/116]
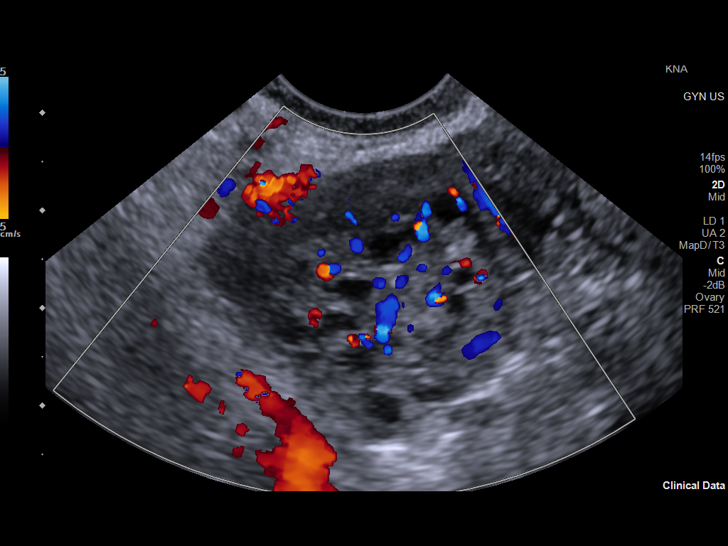
[im 96/116]
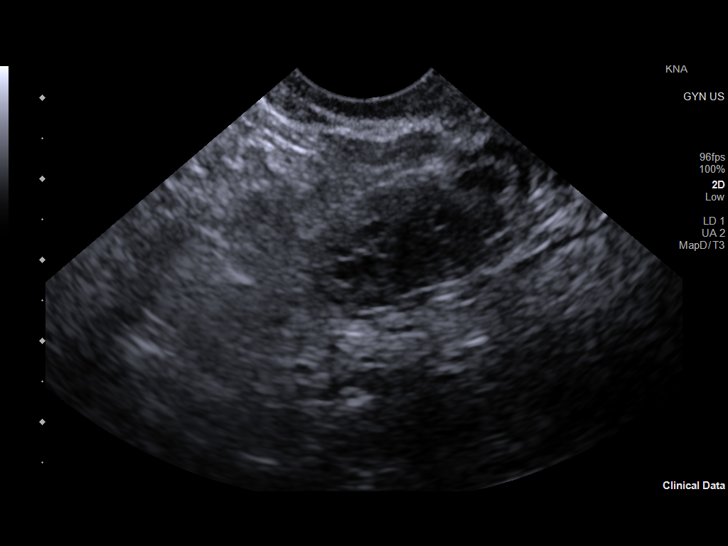
[im 106/116]
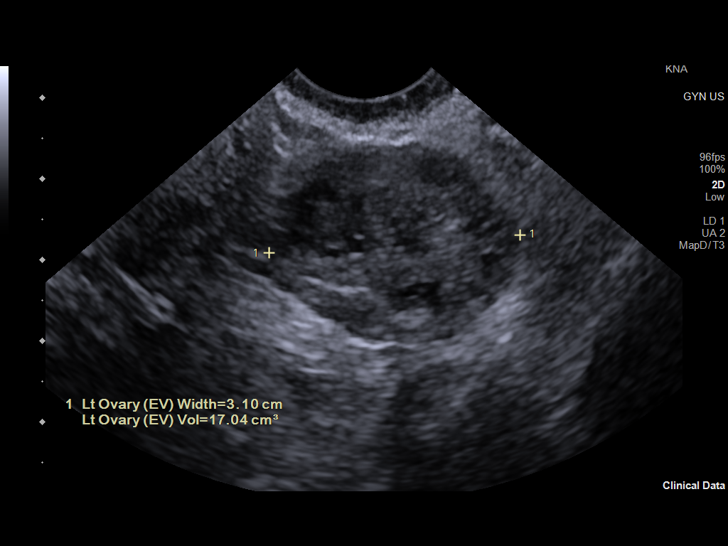
[im 116/116]
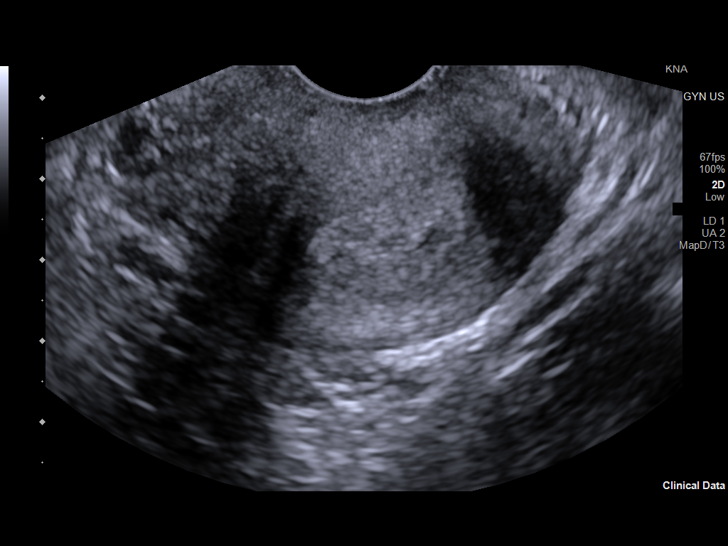

[14 of 25 positions shown; findings below may reference images not displayed]

FINDINGS: Uterus

Measurements: 7.6 x 3.7 x 4.5 cm = volume: 65 mL. Anteverted. Normal
morphology without mass

Endometrium

Thickness: 9 mm.  No endometrial fluid or focal abnormality

Right ovary

Measurements: 2.9 x 2.6 x 2.8 cm = volume: 11.2 mL. Normal
morphology without mass

Left ovary

Measurements: 3.3 x 3.2 x 3.1 cm = volume: 17.0 mL. Normal
morphology without mass

Other findings

No free pelvic fluid.  No adnexal masses.
IMPRESSION: Normal exam.

## 2021-10-03 ENCOUNTER — Other Ambulatory Visit (INDEPENDENT_AMBULATORY_CARE_PROVIDER_SITE_OTHER): Payer: Medicaid Other | Admitting: *Deleted

## 2021-10-03 ENCOUNTER — Other Ambulatory Visit: Payer: Self-pay

## 2021-10-03 ENCOUNTER — Other Ambulatory Visit (HOSPITAL_COMMUNITY)
Admission: RE | Admit: 2021-10-03 | Discharge: 2021-10-03 | Disposition: A | Discharge status: Home / Self Care | Payer: Medicaid Other | Source: Ambulatory Visit | Attending: Obstetrics & Gynecology | Admitting: Obstetrics & Gynecology

## 2021-10-03 DIAGNOSIS — N898 Other specified noninflammatory disorders of vagina: Secondary | ICD-10-CM

## 2021-10-03 DIAGNOSIS — R300 Dysuria: Secondary | ICD-10-CM

## 2021-10-03 DIAGNOSIS — Z3202 Encounter for pregnancy test, result negative: Secondary | ICD-10-CM | POA: Diagnosis not present

## 2021-10-03 DIAGNOSIS — R3915 Urgency of urination: Secondary | ICD-10-CM

## 2021-10-03 DIAGNOSIS — R309 Painful micturition, unspecified: Secondary | ICD-10-CM

## 2021-10-03 LAB — POCT URINE PREGNANCY: Preg Test, Ur: NEGATIVE

## 2021-10-03 NOTE — Progress Notes (Addendum)
   NURSE VISIT- VAGINITIS/STD/POC  SUBJECTIVE:  Kaitlyn Meyer is a 20 y.o. G0P0000 GYN patientfemale here for a vaginal swab for vaginitis screening, STD screen.  She reports the following symptoms:  vaginal odor and discharge  for 2 days. Denies abnormal vaginal bleeding, significant pelvic pain, fever. Pt reports urgency to urinate and pain/burning with urination that started this am. Urine sent to lab for culture. Pt requested a UPT. It was negative.   OBJECTIVE:  There were no vitals taken for this visit.  Appears well, in no apparent distress  ASSESSMENT: Vaginal swab for vaginitis screening & STD screening.   PLAN: Self-collected vaginal probe for Gonorrhea, Chlamydia, Trichomonas, Bacterial Vaginosis, Yeast sent to lab. Urine culture sent to lab.  Treatment: to be determined once results are received Follow-up as needed if symptoms persist/worsen, or new symptoms develop  [name]  10/03/2021 11:32 AM  Chart reviewed for nurse visit. Agree with plan of care.  Cheral Marker, [place] 10/03/2021 12:30 PM

## 2021-10-04 ENCOUNTER — Other Ambulatory Visit: Payer: Self-pay | Admitting: Women's Health

## 2021-10-04 LAB — CERVICOVAGINAL ANCILLARY ONLY
Bacterial Vaginitis (gardnerella): POSITIVE — AB
Candida Glabrata: NEGATIVE
Candida Vaginitis: NEGATIVE
Chlamydia: NEGATIVE
Comment: NEGATIVE
Comment: NEGATIVE
Comment: NEGATIVE
Comment: NEGATIVE
Comment: NEGATIVE
Comment: NORMAL
Neisseria Gonorrhea: NEGATIVE
Trichomonas: NEGATIVE

## 2021-10-04 MED ORDER — METRONIDAZOLE 500 MG PO TABS: 500.0000 mg | ORAL_TABLET | Freq: Two times a day (BID) | ORAL | 0 refills | Status: DC

## 2021-10-05 LAB — URINE CULTURE

## 2021-10-06 ENCOUNTER — Ambulatory Visit: Payer: Medicaid Other | Admitting: Adult Health

## 2021-10-30 NOTE — L&D Delivery Note (Addendum)
  Patient: Kaitlyn Meyer MRN: [id number]  GBS status: negative Triple I:  received ampicillin and gentamicin during labor  Patient is a 21 y.o. now G1P1001 s/p NSVD at [redacted]w[redacted]d, who was admitted for IOL for decreased fetal movement. IOL achieved with Cytotec, foley, and Pitocin. SROM 24h 66m prior to delivery with light mec fluid.    Delivery Note At 2:35 AM a viable female was delivered via Vaginal, Spontaneous (Presentation: Left Occiput Anterior).  APGAR: 5, 9; weight 8 lb 14.2 oz (4030 g).   Placenta status: Spontaneous;Pathology, Intact.  Cord: 3 vessels with the following complications: None.  Cord pH: pending  Provider called to room due to pt feeling pressure and urge to push and was found to be complete at 0215. Head delivered LOA. No nuchal cord present.The shoulders were not forthcoming,so Drenda Freeze was asked to step in.  The posterior (left) axilla was grasped with her index finger, and the baby was rotated clockwise into the oblique diameter.  At this point, the (now) anterior shoulder was released, and the baby delivered.  At no time was any traction placed on the baby's head. Time of delivery 0235.   Infant with spontaneous cry, placed on mother's abdomen, dried and bulb suctioned. Cord clamped x 2 after 1-minute delay, and cut by family member. Baby was later taken to warmer for further evaluation by peds team. Cord blood and blood gasses drawn. Placenta delivered spontaneously with gentle cord traction. Three vessel cord, intact appearing, send to pathology. Fundus firm with massage and Pitocin. Perineum inspected and found to have a second degree perineal and left labial laceration, which was repaired with 2-0 Vicryl in the usual fashion with good hemostasis achieved.   Anesthesia: Epidural;Local Episiotomy: None Lacerations: 2nd degree, Left Labial  Suture Repair: 2.0 vicryl Est. Blood Loss (mL):  318  Mom to postpartum.  Baby to Couplet care / Skin to Skin.  Theo Dills PGY3 Family Medicine Resident  [facility] 09/15/2022, 3:42 AM  The above was performed under my direct supervision and guidance.

## 2022-01-02 ENCOUNTER — Ambulatory Visit (INDEPENDENT_AMBULATORY_CARE_PROVIDER_SITE_OTHER): Payer: Medicaid Other

## 2022-01-02 ENCOUNTER — Other Ambulatory Visit: Payer: Self-pay

## 2022-01-02 VITALS — BP 122/87 | HR 103 | Ht 61.0 in | Wt 145.0 lb

## 2022-01-02 DIAGNOSIS — Z3201 Encounter for pregnancy test, result positive: Secondary | ICD-10-CM

## 2022-01-02 DIAGNOSIS — Z3200 Encounter for pregnancy test, result unknown: Secondary | ICD-10-CM

## 2022-01-02 LAB — POCT URINE PREGNANCY: Preg Test, Ur: POSITIVE — AB

## 2022-01-02 NOTE — Progress Notes (Signed)
? ?  NURSE VISIT- PREGNANCY CONFIRMATION  ? ?SUBJECTIVE:  ?Kaitlyn Meyer is a 21 y.o. G1P0000 female at [redacted]w[redacted]d by certain LMP of Patient's last menstrual period was 11/27/2021 (exact date). Here for pregnancy confirmation.  Home pregnancy test: positive x 15   She reports cramping.  She is not taking prenatal vitamins.   ? ?OBJECTIVE:  ?BP 122/87 (BP Location: Left Arm, Patient Position: Sitting, Cuff Size: Normal)   Pulse (!) 103   Ht 5\' 1"  (1.549 m)   Wt 145 lb (65.8 kg)   LMP 11/27/2021 (Exact Date)   BMI 27.40 kg/m?   ?Appears well, in no apparent distress ? ?Results for orders placed or performed in visit on 01/02/22 (from the past 24 hour(s))  ?POCT urine pregnancy  ? Collection Time: 01/02/22 11:16 AM  ?Result Value Ref Range  ? Preg Test, Ur Positive (A) Negative  ? ? ?ASSESSMENT: ?Positive pregnancy test, [redacted]w[redacted]d by LMP   ? ?PLAN: ?Schedule for dating ultrasound in 2-4 weeks ?Prenatal vitamins: plans to begin OTC ASAP   ?Nausea medicines: not currently needed   ?OB packet given: Yes ? ?Atari Novick A Nishan Ovens  ?01/02/2022 ?11:18 AM  ?

## 2022-01-18 ENCOUNTER — Other Ambulatory Visit: Payer: Self-pay | Admitting: Obstetrics & Gynecology

## 2022-01-18 DIAGNOSIS — O3680X0 Pregnancy with inconclusive fetal viability, not applicable or unspecified: Secondary | ICD-10-CM

## 2022-01-19 ENCOUNTER — Other Ambulatory Visit: Payer: Self-pay

## 2022-01-19 ENCOUNTER — Ambulatory Visit (INDEPENDENT_AMBULATORY_CARE_PROVIDER_SITE_OTHER): Payer: Medicaid Other

## 2022-01-19 DIAGNOSIS — O3680X0 Pregnancy with inconclusive fetal viability, not applicable or unspecified: Secondary | ICD-10-CM | POA: Diagnosis not present

## 2022-01-19 NOTE — Progress Notes (Signed)
[place] 6+4 wks,single IUP with YS,FHR 130 bpm,normal ovaries,CRL 5.99 mm ?

## 2022-01-20 ENCOUNTER — Telehealth: Payer: Self-pay | Admitting: Obstetrics & Gynecology

## 2022-01-20 NOTE — Telephone Encounter (Signed)
Patient is confused about her gestational age and timing of next u/s. She stated her first day of last menstrual period was 1/29. Please advise.  ?

## 2022-01-20 NOTE — Telephone Encounter (Signed)
Returned pt's call, two identifiers used. Questions regarding [place] and due date answered. Pt confirmed understanding. ?

## 2022-02-10 ENCOUNTER — Other Ambulatory Visit: Payer: Self-pay

## 2022-02-10 ENCOUNTER — Inpatient Hospital Stay (HOSPITAL_COMMUNITY)
Admission: AD | Admit: 2022-02-10 | Discharge: 2022-02-11 | Disposition: A | Discharge status: Home / Self Care | Payer: Medicaid Other | Attending: Obstetrics and Gynecology | Admitting: Obstetrics and Gynecology

## 2022-02-10 DIAGNOSIS — R109 Unspecified abdominal pain: Secondary | ICD-10-CM | POA: Insufficient documentation

## 2022-02-10 DIAGNOSIS — Z3491 Encounter for supervision of normal pregnancy, unspecified, first trimester: Secondary | ICD-10-CM

## 2022-02-10 DIAGNOSIS — Z3A09 9 weeks gestation of pregnancy: Secondary | ICD-10-CM

## 2022-02-10 DIAGNOSIS — O26891 Other specified pregnancy related conditions, first trimester: Secondary | ICD-10-CM | POA: Insufficient documentation

## 2022-02-10 NOTE — MAU Note (Signed)
.  Kaitlyn Meyer is a 21 y.o. at [redacted]w[redacted]d here in MAU reporting mild abdominal cramping for past couple of days. She has had mild nausea but is concerned she is not having more signs of pregnancy as she read that could be a sign of miscarriage. Mostly came for reassurance that pregnancy is growing ok. Denies vaginal bleeding ?LMP: [facility] 09/10/22 ?Onset of complaint: 2 days ago ?Pain score: 2 ?Vitals:  ? 02/10/22 2336 02/10/22 2337  ?BP:  115/76  ?Pulse: 85   ?Temp: 98.4 ?F (36.9 ?C)   ?SpO2: 100%   ?   ?FHT:n/a ?Lab orders placed from triage:  u/a ? ?

## 2022-02-11 DIAGNOSIS — Z3A09 9 weeks gestation of pregnancy: Secondary | ICD-10-CM

## 2022-02-11 DIAGNOSIS — R109 Unspecified abdominal pain: Secondary | ICD-10-CM | POA: Diagnosis not present

## 2022-02-11 DIAGNOSIS — O26891 Other specified pregnancy related conditions, first trimester: Secondary | ICD-10-CM

## 2022-02-11 LAB — URINALYSIS, ROUTINE W REFLEX MICROSCOPIC
Bilirubin Urine: NEGATIVE
Glucose, UA: NEGATIVE mg/dL
Hgb urine dipstick: NEGATIVE
Ketones, ur: 20 mg/dL — AB
Nitrite: NEGATIVE
Protein, ur: NEGATIVE mg/dL
Specific Gravity, Urine: 1.013 (ref 1.005–1.030)
pH: 6 (ref 5.0–8.0)

## 2022-02-11 NOTE — MAU Provider Note (Signed)
Chief Complaint: Abdominal Pain ? ? Event Date/Time  ? First Provider Initiated Contact with Patient 02/11/22 0520   ?  ? ?SUBJECTIVE ?HPI: Kaitlyn Meyer is a 21 y.o. G1P0000 at [redacted]w[redacted]d with confirmed IUP by early ultrasound who presents to maternity admissions reporting cramping x 3 days.  She reports the cramping is light but has been persistent since it started.  There are no other symptoms. ? ? ?No past medical history on file. ?Past Surgical History:  ?Procedure Laterality Date  ? NO PAST SURGERIES    ? ?Social History  ? ?Socioeconomic History  ? Marital status: Single  ?  Spouse name: Not on file  ? Number of children: Not on file  ? Years of education: Not on file  ? Highest education level: Not on file  ?Occupational History  ? Not on file  ?Tobacco Use  ? Smoking status: Former  ?  Packs/day: 0.25  ?  Types: Cigarettes  ? Smokeless tobacco: Never  ?Vaping Use  ? Vaping Use: Never used  ?Substance and Sexual Activity  ? Alcohol use: No  ? Drug use: No  ? Sexual activity: Yes  ?Other Topics Concern  ? Not on file  ?Social History Narrative  ? Not on file  ? ?Social Determinants of Health  ? ?Financial Resource Strain: Medium Risk  ? Difficulty of Paying Living Expenses: Somewhat hard  ?Food Insecurity: No Food Insecurity  ? Worried About Programme researcher, broadcasting/film/video in the Last Year: Never true  ? Ran Out of Food in the Last Year: Never true  ?Transportation Needs: No Transportation Needs  ? Lack of Transportation (Medical): No  ? Lack of Transportation (Non-Medical): No  ?Physical Activity: Sufficiently Active  ? Days of Exercise per Week: 4 days  ? Minutes of Exercise per Session: 60 min  ?Stress: Stress Concern Present  ? Feeling of Stress : To some extent  ?Social Connections: Socially Isolated  ? Frequency of Communication with Friends and Family: More than three times a week  ? Frequency of Social Gatherings with Friends and Family: Once a week  ? Attends Religious Services: Never  ? Active Member of Clubs  or Organizations: No  ? Attends Banker Meetings: Never  ? Marital Status: Never married  ?Intimate Partner Violence: Not At Risk  ? Fear of Current or Ex-Partner: No  ? Emotionally Abused: No  ? Physically Abused: No  ? Sexually Abused: No  ? ?No current facility-administered medications on file prior to encounter.  ? ?Current Outpatient Medications on File Prior to Encounter  ?Medication Sig Dispense Refill  ? [DISCONTINUED] fluticasone (FLONASE) 50 MCG/ACT nasal spray Place 2 sprays into both nostrils daily. 16 g 0  ? [DISCONTINUED] traZODone (DESYREL) 50 MG tablet TAKE 1 2 TABLET 2 TIMES DAILY AS NEEDED FOR ANXIETY AND 1 2 BY MOUTH AT BEDTIME FOR INSOMNIA AS NEEDED    ? ?No Known Allergies ? ?ROS:  ?Review of Systems  ?Constitutional:  Negative for chills, fatigue and fever.  ?Respiratory:  Negative for shortness of breath.   ?Cardiovascular:  Negative for chest pain.  ?Gastrointestinal:  Positive for abdominal pain.  ?Genitourinary:  Negative for difficulty urinating, dysuria, flank pain, pelvic pain, vaginal bleeding, vaginal discharge and vaginal pain.  ?Neurological:  Negative for dizziness and headaches.  ?Psychiatric/Behavioral: Negative.    ? ? ?I have reviewed patient's Past Medical Hx, Surgical Hx, Family Hx, Social Hx, medications and allergies.  ? ?Physical Exam  ?Patient Vitals for the  past 24 hrs: ? BP Temp Pulse SpO2 Height Weight  ?02/10/22 2337 115/76 -- -- -- -- --  ?02/10/22 2336 -- 98.4 ?F (36.9 ?C) 85 100 % 5\' 1"  (1.549 m) 68 kg  ? ?Constitutional: Well-developed, well-nourished female in no acute distress.  ?Cardiovascular: normal rate ?Respiratory: normal effort ?GI: Abd soft, non-tender. Pos BS x 4 ?MS: Extremities nontender, no edema, normal ROM ?Neurologic: Alert and oriented x 4.  ?GU: Neg CVAT. ? ? ?LAB RESULTS ?Results for orders placed or performed during the hospital encounter of 02/10/22 (from the past 24 hour(s))  ?Urinalysis, Routine w reflex microscopic Urine, Clean  Catch     Status: Abnormal  ? Collection Time: 02/11/22 12:27 AM  ?Result Value Ref Range  ? Color, Urine YELLOW YELLOW  ? APPearance HAZY (A) CLEAR  ? Specific Gravity, Urine 1.013 1.005 - 1.030  ? pH 6.0 5.0 - 8.0  ? Glucose, UA NEGATIVE NEGATIVE mg/dL  ? Hgb urine dipstick NEGATIVE NEGATIVE  ? Bilirubin Urine NEGATIVE NEGATIVE  ? Ketones, ur 20 (A) NEGATIVE mg/dL  ? Protein, ur NEGATIVE NEGATIVE mg/dL  ? Nitrite NEGATIVE NEGATIVE  ? Leukocytes,Ua MODERATE (A) NEGATIVE  ? RBC / HPF 0-5 0 - 5 RBC/hpf  ? WBC, UA 0-5 0 - 5 WBC/hpf  ? Bacteria, UA RARE (A) NONE SEEN  ? Squamous Epithelial / LPF 0-5 0 - 5  ? Mucus PRESENT   ? ? ?  ? ?IMAGING ?Limited OB US ?Date: 02/11/22 ?EDD : 09/10/22 based on early US ?Viability:  FHT detected CRL measurement c/w previous dates ? ? ?Pt informed that the ultrasound is considered a limited OB ultrasound and is not intended to be a complete ultrasound exam.  Patient also informed that the ultrasound is not being completed with the intent of assessing for fetal or placental anomalies or any pelvic abnormalities.  Explained that the purpose of today?s ultrasound is to assess for  viability.  Patient acknowledges the purpose of the exam and the limitations of the study.    ?US OB Comp Less 14 Wks ? ?Result Date: 01/20/2022 ?Table formatting from the original result was not included. Images from the original result were not included.  ..an [employer] Technical sales engineer(AI[employer]) accredited practice Center for [facility] [address] [place], [id number] Ordering Provider: Myna Hidalgozan, Jennifer, DO                                                                                                                             DATING AND VIABILITY SONOGRAM Kaitlyn Meyer is a 21 y.o. year old G1P0000  Patient's last menstrual period was 11/27/2021 (exact date). which would correlate to  3915w4d weeks gestation.  She has irregular menstrual cycles.   She is here  today for a confirmatory initial sonogram. GESTATION: SINGLETON   FETAL ACTIVITY:          Heart rate  130     CERVIX: Appears closed ADNEXA: The ovaries are normal. GESTATIONAL AGE AND  BIOMETRICS: Gestational criteria: Estimated Date of Delivery: 09/03/22 by LMP now at [redacted]w[redacted]d Previous Scans:0    CROWN RUMP LENGTH           5.99 mm         6+4 weeks                                                                      AVERAGE EGA(BY THIS SCAN):  6+4 weeks WORKING EDD( early ultrasound ):  09/10/2022  TECHNICIAN COMMENTS: [place] 6+4 wks,single IUP with YS,FHR 130 bpm,normal ovaries,CRL 5.99 mm A copy of this report including all images has been saved and backed up to a second source for retrieval if needed. All measures and details of the anatomical scan, placentation, fluid volume and pelvic anatomy are contained in that report. Amber Flora Lipps 01/19/2022 4:19 PM Clinical Impression and recommendations: I have reviewed the sonogram results above, combined with the patient's current clinical course, below are my impressions and any appropriate recommendations for management based on the sonographic findings. Viable early IUP G1P0000 Estimated Date of Delivery: 09/10/22 early ultrasound Normal general sonographic findings Recommend routine care unless otherwise clinically indicated Sharon Seller 01/20/2022 9:03 AM  ? ?MAU Management/MDM: ?Orders Placed This Encounter  ?Procedures  ? Urinalysis, Routine w reflex microscopic Urine, Clean Catch  ? Discharge patient  ?  ?No orders of the defined types were placed in this encounter. ?  ?Bedside US reveals live IUP, reassurance provided.  Pt to f/u at [facility] as scheduled, return to MAU for emergencies.   ? ?ASSESSMENT ?1. Abdominal pain during pregnancy in first trimester   ?2. Normal IUP (intrauterine pregnancy) on prenatal ultrasound, first trimester   ?3. [redacted] weeks gestation of pregnancy   ? ? ?PLAN ?Discharge home ?Allergies as of 02/11/2022   ?No Known Allergies ?  ? ?   ?Medication List  ?  ?You have not been prescribed any medications. ?  ? ? ? ?Misty Stanley Leftwich-Kirby ?Certified Nurse-Midwife ?02/11/2022  ?5:27 AM ? ? ?  ?

## 2022-02-12 LAB — CULTURE, OB URINE: Culture: <10000 — AB

## 2022-02-22 ENCOUNTER — Encounter: Payer: Self-pay | Admitting: Women's Health

## 2022-02-22 ENCOUNTER — Encounter: Payer: Self-pay | Admitting: Family Medicine

## 2022-02-22 NOTE — Telephone Encounter (Signed)
Called patient. Her upcoming appointment and ultrasound is with [facility]. Patient stated she will contact their office concerning the appointment.  ?

## 2022-02-28 ENCOUNTER — Other Ambulatory Visit: Payer: Self-pay | Admitting: Obstetrics & Gynecology

## 2022-02-28 DIAGNOSIS — Z3682 Encounter for antenatal screening for nuchal translucency: Secondary | ICD-10-CM

## 2022-03-02 ENCOUNTER — Encounter: Payer: Self-pay | Admitting: Obstetrics & Gynecology

## 2022-03-02 ENCOUNTER — Ambulatory Visit (INDEPENDENT_AMBULATORY_CARE_PROVIDER_SITE_OTHER): Payer: Medicaid Other

## 2022-03-02 ENCOUNTER — Ambulatory Visit (INDEPENDENT_AMBULATORY_CARE_PROVIDER_SITE_OTHER): Payer: Medicaid Other | Admitting: Obstetrics & Gynecology

## 2022-03-02 VITALS — BP 117/65 | HR 70 | Wt 150.0 lb

## 2022-03-02 DIAGNOSIS — Z3A12 12 weeks gestation of pregnancy: Secondary | ICD-10-CM

## 2022-03-02 DIAGNOSIS — Z3401 Encounter for supervision of normal first pregnancy, first trimester: Secondary | ICD-10-CM

## 2022-03-02 DIAGNOSIS — Z3682 Encounter for antenatal screening for nuchal translucency: Secondary | ICD-10-CM

## 2022-03-02 DIAGNOSIS — Z3400 Encounter for supervision of normal first pregnancy, unspecified trimester: Secondary | ICD-10-CM | POA: Insufficient documentation

## 2022-03-02 DIAGNOSIS — Z3402 Encounter for supervision of normal first pregnancy, second trimester: Secondary | ICD-10-CM

## 2022-03-02 LAB — POCT URINALYSIS DIPSTICK OB
Blood, UA: NEGATIVE
Glucose, UA: NEGATIVE
Ketones, UA: NEGATIVE
Leukocytes, UA: NEGATIVE
Nitrite, UA: NEGATIVE
POC,PROTEIN,UA: NEGATIVE

## 2022-03-02 MED ORDER — BLOOD PRESSURE MONITOR MISC: 0 refills | Status: DC

## 2022-03-02 NOTE — Progress Notes (Signed)
[place] 12+4 wks,measurements c/w dates,CRL 67.05 mm,NB present,NT 1.5 mm,FHR 160 bpm,anterior placenta,normal ovaries ?

## 2022-03-02 NOTE — Progress Notes (Signed)
? ?INITIAL OBSTETRICAL VISIT ?Patient name: Kaitlyn Meyer MRN [id number]  Date of birth: [birth year] ?Chief Complaint:   ?Initial Prenatal Visit ? ?History of Present Illness:   ?Kaitlyn Meyer is a 21 y.o. G95P0000 Hispanic female at [redacted]w[redacted]d by [place] at 6 weeks with an Estimated Date of Delivery: 09/10/22 being seen today for her initial obstetrical visit.   ?Her obstetrical history is significant for  uncomplicated .   ?Today she reports nausea.  ? ?  03/02/2022  ?  9:38 AM 06/27/2021  ?  1:41 PM 03/24/2021  ? 12:03 PM 06/04/2018  ?  3:41 PM  ?Depression screen PHQ 2/9  ?Decreased Interest 0 3 2 3   ?Down, Depressed, Hopeless 0 1 2 3   ?PHQ - 2 Score 0 4 4 6   ?Altered sleeping 2 3 2 3   ?Tired, decreased energy 2 3 2 3   ?Change in appetite 2 3 2 3   ?Feeling bad or failure about yourself  0 1 2 3   ?Trouble concentrating 0 3 2 3   ?Moving slowly or fidgety/restless 0 2 2 3   ?Suicidal thoughts 0 0 0 3  ?PHQ-9 Score 6 19 16 27   ?Difficult doing work/chores    Extremely dIfficult  ? ? ?Patient's last menstrual period was 11/27/2021 (exact date). ?Last pap 05/2021. Results were:  Pap neg ?Review of Systems:   ?Pertinent items are noted in HPI ?Denies cramping/contractions, leakage of fluid, vaginal bleeding, abnormal vaginal discharge w/ itching/odor/irritation, headaches, visual changes, shortness of breath, chest pain, abdominal pain, severe nausea/vomiting, or problems with urination or bowel movements unless otherwise stated above.  ?Pertinent History Reviewed:  ?Reviewed past medical,surgical, social, obstetrical and family history.  ?Reviewed problem list, medications and allergies. ?OB History  ?Gravida Para Term Preterm AB Living  ?1 0 0 0 0 0  ?SAB IAB Ectopic Multiple Live Births  ?0 0 0 0 0  ?  ?# Outcome Date GA Lbr Len/2nd Weight Sex Delivery Anes PTL Lv  ?1 Current           ? ?Physical Assessment:  ? ?Vitals:  ? 03/02/22 0935  ?BP: 117/65  ?Pulse: 70  ?Weight: 150 lb (68 kg)  ?Body mass index is 28.34 kg/m?. ? ?      Physical Examination: ? General appearance - well appearing, and in no distress ? Mental status - alert, oriented to person, place, and time ? Psych:  She has a normal mood and affect ? Skin - warm and dry, normal color, no suspicious lesions noted ? Chest - effort normal, all lung fields clear to auscultation bilaterally ? Breast: no masses noted, nipples unremarkable, no axillary lymphadenopathy ?Heart - normal rate and regular rhythm ? Abdomen - soft, nontender ? Extremities:  No swelling or varicosities noted ? Pelvic - VULVA: normal appearing vulva with no masses, tenderness or lesions  VAGINA: normal appearing vagina with normal color and discharge, no lesions  CERVIX: normal appearing cervix without discharge or lesions, no CMT ?  ?Chaperone: Glenard Haring Neas   ? ?TODAY'S NT - [place] 12+4 wks,measurements c/w dates,CRL 67.05 mm,NB present,NT 1.5 mm,FHR 160 bpm,anterior placenta,normal ovaries ? ? ?Results for orders placed or performed in visit on 03/02/22 (from the past 24 hour(s))  ?POC Urinalysis Dipstick OB  ? Collection Time: 03/02/22 10:30 AM  ?Result Value Ref Range  ? Color, UA    ? Clarity, UA    ? Glucose, UA Negative Negative  ? Bilirubin, UA    ? Ketones, UA neg   ?  Spec Grav, UA    ? Blood, UA neg   ? pH, UA    ? POC,PROTEIN,UA Negative Negative, Trace, Small (1+), Moderate (2+), Large (3+), 4+  ? Urobilinogen, UA    ? Nitrite, UA neg   ? Leukocytes, UA Negative Negative  ? Appearance    ? Odor    ?  ?Assessment & Plan:  ?1) Low-Risk Pregnancy G1P0000 at [redacted]w[redacted]d with an Estimated Date of Delivery: 09/10/22  ? ?2) Initial OB visit ? ?3) Nausea ?-reviewed OTC remedies ?-Rx for zofran sent in ?-reviewed precautions for when to call office ? ?Meds:  ?Meds ordered this encounter  ?Medications  ? Blood Pressure Monitor MISC  ?  Sig: For regular home bp monitoring during pregnancy  ?  Dispense:  1 each  ?  Refill:  0  ?  Z34.81 ?Please mail to patient  ? ? ?Initial labs obtained ?Continue prenatal  vitamins ?Reviewed n/v relief measures and warning s/s to report ?Reviewed recommended weight gain based on pre-gravid BMI ?Encouraged well-balanced diet ?Genetic & carrier screening discussed: requests Panorama and NT/IT ?Ultrasound discussed; fetal survey: results reviewed ?CCNC completed> form faxed if has or is planning to apply for medicaid ?The nature of [place] for [facility] with multiple MDs and other Advanced Practice Providers was explained to patient; also emphasized that fellows, residents, and students are part of our team. ?Rx for BP cuff written ? ?Indications for ASA therapy (per uptodate) ?One of the following: ?H/O preeclampsia, especially early onset/adverse outcome No ?Multifetal gestation No ?CHTN No ?T1DM or T2DM No ?Chronic kidney disease No ?Autoimmune disease (antiphospholipid syndrome, systemic lupus erythematosus) No ? ?OR Two or more of the following: ?Nulliparity Yes ?Obesity (BMI>30 kg/m2) No ?Family h/o preeclampsia in mother or sister No ?Age ?35 years No ?Sociodemographic characteristics (African American race, low socioeconomic level) No ?Personal risk factors (eg, previous pregnancy w/ LBW or SGA, previous adverse pregnancy outcome [eg, stillbirth], interval >10 years between pregnancies) No ? ?Indications for early A1C (per uptodate) ?BMI >=25 (>=23 in Asian women) AND one of the following ?GDM in a previous pregnancy No ?Previous A1C?5.7, impaired glucose tolerance, or impaired fasting glucose on previous testing No ?First-degree relative with diabetes Yes ?High-risk race/ethnicity (eg, African American, Latino, Native American, [place] American, [place] Islander) No ?History of cardiovascular disease No ?HTN or on therapy for hypertension No ?HDL cholesterol level <35 mg/dL (0.90 mmol/L) and/or a triglyceride level >250 mg/dL (2.82 mmol/L) No ?PCOS Yes ?Physical inactivity Yes ?Other clinical condition associated with insulin resistance (eg, severe obesity,  acanthosis nigricans) No ?Previous birth of an infant weighing ?4000 g No ?Previous stillbirth of unknown cause No ?>= 40yo No ? ?Follow-up: Return in about 4 weeks (around 03/30/2022) for [place] visit.  ? ?Orders Placed This Encounter  ?Procedures  ? Urine Culture  ? GC/Chlamydia Probe Amp  ? Integrated 1  ? Genetic Screening  ? CBC/D/Plt+RPR+Rh+ABO+RubIgG...  ? HgB A1c  ? POC Urinalysis Dipstick OB  ? ? ?Janyth Pupa, DO ?Attending [place], Faculty Practice ?Center for [place] ? ? ? ?

## 2022-03-03 LAB — HEMOGLOBIN A1C
Est. average glucose Bld gHb Est-mCnc: 100 mg/dL
Hgb A1c MFr Bld: 5.1 % (ref 4.8–5.6)

## 2022-03-04 LAB — INTEGRATED 1
Crown Rump Length: 67.1 mm
Gest. Age on Collection Date: 12.9 weeks
Maternal Age at EDD: 22.3 yr
Nuchal Translucency (NT): 1.5 mm
Number of Fetuses: 1
PAPP-A Value: 1118.2 ng/mL
Weight: 150 [lb_av]

## 2022-03-04 LAB — CBC/D/PLT+RPR+RH+ABO+RUBIGG...
Antibody Screen: NEGATIVE
Basophils Absolute: 0 10*3/uL (ref 0.0–0.2)
Basos: 0 %
EOS (ABSOLUTE): 0 10*3/uL (ref 0.0–0.4)
Eos: 0 %
HCV Ab: NONREACTIVE
HIV Screen 4th Generation wRfx: NONREACTIVE
Hematocrit: 40.1 % (ref 34.0–46.6)
Hemoglobin: 13.7 g/dL (ref 11.1–15.9)
Hepatitis B Surface Ag: NEGATIVE
Immature Grans (Abs): 0 10*3/uL (ref 0.0–0.1)
Immature Granulocytes: 0 %
Lymphocytes Absolute: 1.1 10*3/uL (ref 0.7–3.1)
Lymphs: 13 %
MCH: 30.3 pg (ref 26.6–33.0)
MCHC: 34.2 g/dL (ref 31.5–35.7)
MCV: 89 fL (ref 79–97)
Monocytes Absolute: 0.4 10*3/uL (ref 0.1–0.9)
Monocytes: 4 %
Neutrophils Absolute: 7.2 10*3/uL — ABNORMAL HIGH (ref 1.4–7.0)
Neutrophils: 83 %
Platelets: 292 10*3/uL (ref 150–450)
RBC: 4.52 x10E6/uL (ref 3.77–5.28)
RDW: 13.7 % (ref 11.7–15.4)
RPR Ser Ql: NONREACTIVE
Rh Factor: POSITIVE
Rubella Antibodies, IGG: <0.9 index — ABNORMAL LOW (ref >0.99)
WBC: 8.8 10*3/uL (ref 3.4–10.8)

## 2022-03-04 LAB — GC/CHLAMYDIA PROBE AMP
Chlamydia trachomatis, NAA: NEGATIVE
Neisseria Gonorrhoeae by PCR: NEGATIVE

## 2022-03-04 LAB — HCV INTERPRETATION

## 2022-03-05 LAB — URINE CULTURE

## 2022-03-06 ENCOUNTER — Other Ambulatory Visit: Payer: Self-pay | Admitting: Women's Health

## 2022-03-06 MED ORDER — ONDANSETRON HCL 4 MG PO TABS: 4.0000 mg | ORAL_TABLET | Freq: Three times a day (TID) | ORAL | 6 refills | Status: DC | PRN

## 2022-03-16 ENCOUNTER — Other Ambulatory Visit: Payer: Self-pay

## 2022-03-16 ENCOUNTER — Inpatient Hospital Stay (HOSPITAL_COMMUNITY)
Admission: AD | Admit: 2022-03-16 | Discharge: 2022-03-17 | Disposition: A | Discharge status: Home / Self Care | Payer: Medicaid Other | Attending: Obstetrics and Gynecology | Admitting: Obstetrics and Gynecology

## 2022-03-16 DIAGNOSIS — R519 Headache, unspecified: Secondary | ICD-10-CM | POA: Diagnosis not present

## 2022-03-16 DIAGNOSIS — Z3A14 14 weeks gestation of pregnancy: Secondary | ICD-10-CM

## 2022-03-16 DIAGNOSIS — R112 Nausea with vomiting, unspecified: Secondary | ICD-10-CM | POA: Diagnosis not present

## 2022-03-16 DIAGNOSIS — O219 Vomiting of pregnancy, unspecified: Secondary | ICD-10-CM | POA: Diagnosis not present

## 2022-03-16 DIAGNOSIS — O26892 Other specified pregnancy related conditions, second trimester: Secondary | ICD-10-CM | POA: Diagnosis present

## 2022-03-16 LAB — URINALYSIS, ROUTINE W REFLEX MICROSCOPIC
Bilirubin Urine: NEGATIVE
Glucose, UA: NEGATIVE mg/dL
Hgb urine dipstick: NEGATIVE
Ketones, ur: NEGATIVE mg/dL
Nitrite: NEGATIVE
Protein, ur: NEGATIVE mg/dL
Specific Gravity, Urine: 1.015 (ref 1.005–1.030)
pH: 6 (ref 5.0–8.0)

## 2022-03-16 NOTE — MAU Note (Signed)
.  Kaitlyn Meyer is a 21 y.o. at [redacted]w[redacted]d here in MAU reporting n/v this week. Nausea med is not helping. Fears she is getting dehydrated. Denies VB. No pain. Last BM was 2 days ago.  LMP: EDC 09/10/22 Onset of complaint: this wk Pain score: 0 Vitals:   03/16/22 2215  BP: 113/75  Pulse: 87  Resp: 16  Temp: 98 F (36.7 C)  SpO2: 100%     [id number]:[id number] to hear FHTs in Triage Lab orders placed from triage:  u/a

## 2022-03-17 ENCOUNTER — Encounter (HOSPITAL_COMMUNITY): Payer: Self-pay | Admitting: Obstetrics and Gynecology

## 2022-03-17 DIAGNOSIS — O26892 Other specified pregnancy related conditions, second trimester: Secondary | ICD-10-CM | POA: Diagnosis not present

## 2022-03-17 DIAGNOSIS — Z3A14 14 weeks gestation of pregnancy: Secondary | ICD-10-CM

## 2022-03-17 DIAGNOSIS — R519 Headache, unspecified: Secondary | ICD-10-CM

## 2022-03-17 DIAGNOSIS — R112 Nausea with vomiting, unspecified: Secondary | ICD-10-CM | POA: Diagnosis not present

## 2022-03-17 LAB — COMPREHENSIVE METABOLIC PANEL
ALT: 16 U/L (ref 0–44)
AST: 17 U/L (ref 15–41)
Albumin: 3.3 g/dL — ABNORMAL LOW (ref 3.5–5.0)
Alkaline Phosphatase: 25 U/L — ABNORMAL LOW (ref 38–126)
Anion gap: 7 (ref 5–15)
BUN: 7 mg/dL (ref 6–20)
CO2: 22 mmol/L (ref 22–32)
Calcium: 9.4 mg/dL (ref 8.9–10.3)
Chloride: 106 mmol/L (ref 98–111)
Creatinine, Ser: 0.53 mg/dL (ref 0.44–1.00)
GFR, Estimated: >60 mL/min (ref >60)
Glucose, Bld: 88 mg/dL (ref 70–99)
Potassium: 4 mmol/L (ref 3.5–5.1)
Sodium: 135 mmol/L (ref 135–145)
Total Bilirubin: 0.5 mg/dL (ref 0.3–1.2)
Total Protein: 7 g/dL (ref 6.5–8.1)

## 2022-03-17 LAB — CBC
HCT: 34.3 % — ABNORMAL LOW (ref 36.0–46.0)
Hemoglobin: 11.7 g/dL — ABNORMAL LOW (ref 12.0–15.0)
MCH: 29.7 pg (ref 26.0–34.0)
MCHC: 34.1 g/dL (ref 30.0–36.0)
MCV: 87.1 fL (ref 80.0–100.0)
Platelets: 241 10*3/uL (ref 150–400)
RBC: 3.94 MIL/uL (ref 3.87–5.11)
RDW: 13.9 % (ref 11.5–15.5)
WBC: 11.8 10*3/uL — ABNORMAL HIGH (ref 4.0–10.5)
nRBC: 0 % (ref 0.0–0.2)

## 2022-03-17 MED ORDER — SCOPOLAMINE 1 MG/3DAYS TD PT72: 1.0000 | MEDICATED_PATCH | TRANSDERMAL | 12 refills | Status: DC

## 2022-03-17 MED ORDER — PROMETHAZINE HCL 25 MG PO TABS
25.0000 mg | ORAL_TABLET | Freq: Once | ORAL | Status: AC
  Administered 2022-03-17: 25 mg via ORAL
  Filled 2022-03-17: qty 1

## 2022-03-17 MED ORDER — ONDANSETRON 4 MG PO TBDP
4.0000 mg | ORAL_TABLET | Freq: Once | ORAL | Status: AC
  Administered 2022-03-17: 4 mg via ORAL
  Filled 2022-03-17: qty 1

## 2022-03-17 MED ORDER — ONDANSETRON 4 MG PO TBDP: 4.0000 mg | ORAL_TABLET | Freq: Four times a day (QID) | ORAL | 0 refills | Status: DC | PRN

## 2022-03-17 MED ORDER — VITAMIN B-6 100 MG PO TABS
100.0000 mg | ORAL_TABLET | Freq: Once | ORAL | Status: AC
  Administered 2022-03-17: 100 mg via ORAL
  Filled 2022-03-17: qty 1

## 2022-03-17 MED ORDER — ACETAMINOPHEN-CAFFEINE 500-65 MG PO TABS
2.0000 | ORAL_TABLET | Freq: Once | ORAL | Status: AC
  Administered 2022-03-17: 2 via ORAL
  Filled 2022-03-17: qty 2

## 2022-03-17 MED ORDER — SCOPOLAMINE 1 MG/3DAYS TD PT72
1.0000 | MEDICATED_PATCH | Freq: Once | TRANSDERMAL | Status: DC
  Administered 2022-03-17: 1.5 mg via TRANSDERMAL
  Filled 2022-03-17: qty 1

## 2022-03-17 MED ORDER — PROMETHAZINE HCL 25 MG PO TABS: 25.0000 mg | ORAL_TABLET | Freq: Four times a day (QID) | ORAL | 2 refills | Status: DC | PRN

## 2022-03-17 NOTE — Progress Notes (Signed)
Marie Williams CNM in earlier to discuss d/c plan. [place]itten and verbal d/c instructions given and understanding voiced 

## 2022-03-17 NOTE — MAU Provider Note (Signed)
Chief Complaint: no appetite and Emesis During Pregnancy   Event Date/Time   First Provider Initiated Contact with Patient 03/17/22 0002        SUBJECTIVE HPI: Kaitlyn Meyer is a 21 y.o. G1P0000 at [redacted]w[redacted]d by LMP who presents to maternity admissions reporting nausea and vomiting  for a week which was not helped by zofran.  Has not called office to request other meds.  Was worried that there was something wrong with the baby. . She denies vaginal bleeding, vaginal itching/burning, urinary symptoms, h/a, dizziness, n/v, or fever/chills.    Emesis  This is a recurrent problem. The current episode started 1 to 4 weeks ago. The problem has been unchanged. There has been no fever. Pertinent negatives include no abdominal pain, chest pain, chills, coughing, diarrhea, dizziness or fever. Treatments tried: zofran. The treatment provided no relief.   RN Note: Kaitlyn Meyer is a 21 y.o. at [redacted]w[redacted]d here in MAU reporting n/v this week. Nausea med is not helping. Fears she is getting dehydrated. Denies VB. No pain. Last BM was 2 days ago.  LMP: EDC 09/10/22 Onset of complaint: this wk Pain score: 0  Past Medical History:  Diagnosis Date   Medical history non-contributory    Past Surgical History:  Procedure Laterality Date   NO PAST SURGERIES     Social History   Socioeconomic History   Marital status: Single    Spouse name: Not on file   Number of children: Not on file   Years of education: Not on file   Highest education level: Not on file  Occupational History   Not on file  Tobacco Use   Smoking status: Former    Packs/day: 0.25    Types: Cigarettes   Smokeless tobacco: Never  Vaping Use   Vaping Use: Never used  Substance and Sexual Activity   Alcohol use: No   Drug use: No   Sexual activity: Yes    Birth control/protection: None  Other Topics Concern   Not on file  Social History Narrative   Not on file   Social Determinants of Health   Financial Resource  Strain: Low Risk    Difficulty of Paying Living Expenses: Not hard at all  Food Insecurity: No Food Insecurity   Worried About Programme researcher, broadcasting/film/video in the Last Year: Never true   Ran Out of Food in the Last Year: Never true  Transportation Needs: No Transportation Needs   Lack of Transportation (Medical): No   Lack of Transportation (Non-Medical): No  Physical Activity: Inactive   Days of Exercise per Week: 0 days   Minutes of Exercise per Session: 0 min  Stress: No Stress Concern Present   Feeling of Stress : Not at all  Social Connections: Socially Isolated   Frequency of Communication with Friends and Family: More than three times a week   Frequency of Social Gatherings with Friends and Family: Twice a week   Attends Religious Services: Never   Database administrator or Organizations: No   Attends Engineer, structural: Never   Marital Status: Never married  Catering manager Violence: Not At Risk   Fear of Current or Ex-Partner: No   Emotionally Abused: No   Physically Abused: No   Sexually Abused: No   No current facility-administered medications on file prior to encounter.   Current Outpatient Medications on File Prior to Encounter  Medication Sig Dispense Refill   ondansetron (ZOFRAN) 4 MG tablet Take 1 tablet (  4 mg total) by mouth every 8 (eight) hours as needed for nausea or vomiting. 20 tablet 6   Blood Pressure Monitor MISC For regular home bp monitoring during pregnancy 1 each 0   Prenatal Vit-Fe Fumarate-FA (PRENATAL VITAMIN PO) Take by mouth.     [DISCONTINUED] fluticasone (FLONASE) 50 MCG/ACT nasal spray Place 2 sprays into both nostrils daily. 16 g 0   [DISCONTINUED] traZODone (DESYREL) 50 MG tablet TAKE 1 2 TABLET 2 TIMES DAILY AS NEEDED FOR ANXIETY AND 1 2 BY MOUTH AT BEDTIME FOR INSOMNIA AS NEEDED     No Known Allergies  I have reviewed patient's Past Medical Hx, Surgical Hx, Family Hx, Social Hx, medications and allergies.   ROS:  Review of Systems   Constitutional:  Negative for chills and fever.  Respiratory:  Negative for cough.   Cardiovascular:  Negative for chest pain.  Gastrointestinal:  Positive for vomiting. Negative for abdominal pain and diarrhea.  Neurological:  Negative for dizziness.  Review of Systems  Other systems negative   Physical Exam  Physical Exam Patient Vitals for the past 24 hrs:  BP Temp Pulse Resp SpO2 Height Weight  03/16/22 2215 113/75 98 F (36.7 C) 87 16 100 % 5\' 1"  (1.549 m) 70.3 kg   Constitutional: Well-developed, well-nourished female in no acute distress.  Cardiovascular: normal rate Respiratory: normal effort GI: Abd soft, non-tender. Pos BS x 4 MS: Extremities nontender, no edema, normal ROM Neurologic: Alert and oriented x 4.  GU: Neg CVAT.  FHT 150 by doppler  LAB RESULTS Results for orders placed or performed during the hospital encounter of 03/16/22 (from the past 24 hour(s))  Urinalysis, Routine w reflex microscopic     Status: Abnormal   Collection Time: 03/16/22 11:05 PM  Result Value Ref Range   Color, Urine YELLOW YELLOW   APPearance HAZY (A) CLEAR   Specific Gravity, Urine 1.015 1.005 - 1.030   pH 6.0 5.0 - 8.0   Glucose, UA NEGATIVE NEGATIVE mg/dL   Hgb urine dipstick NEGATIVE NEGATIVE   Bilirubin Urine NEGATIVE NEGATIVE   Ketones, ur NEGATIVE NEGATIVE mg/dL   Protein, ur NEGATIVE NEGATIVE mg/dL   Nitrite NEGATIVE NEGATIVE   Leukocytes,Ua MODERATE (A) NEGATIVE   RBC / HPF 0-5 0 - 5 RBC/hpf   WBC, UA 0-5 0 - 5 WBC/hpf   Bacteria, UA RARE (A) NONE SEEN   Squamous Epithelial / LPF 6-10 0 - 5   Mucus PRESENT     B/Positive/-- (05/04 1100)  IMAGING  MAU Management/MDM: I have reviewed the triage vital signs and the nursing notes.   Pertinent labs & imaging results that were available during my care of the patient were reviewed by me and considered in my medical decision making (see chart for details).      I have reviewed her medical records including past  results, notes and treatments.   Discussed options for treating nausea and vomiting  Unclear if this is early pregnancy or just a gastroenteritis, given that it has only been present for a week.  Has not had diarrhea or fever   UA does not demonstrate dehydration.  Patient states she does get some food and fluids down.  We did run labs to assess for leukocytosis or hypokalemia. These were normal  Treatments in MAU included Zofran, Phenergan, vitamin B6, and scopolamine patch.  We gave her an Excedrin Tension for headache  She states she got complete relief from these.    ASSESSMENT [place] IUP at 2748w5d  Nausea and vomiting Headache  PLAN Discharge home Rx Zofran for nausea if Phenergan does not work Rx Phenergan for nausea Rx Scopolamine patch, warning re: not touching eyes and washing hands Advance diet as tolerated  Pt stable at time of discharge. Encouraged to return here if she develops worsening of symptoms, increase in pain, fever, or other concerning symptoms.    Wynelle Bourgeois CNM, MSN Certified Nurse-Midwife 03/17/2022  12:02 AM

## 2022-03-18 LAB — CULTURE, OB URINE

## 2022-03-30 ENCOUNTER — Encounter: Payer: Self-pay | Admitting: Advanced Practice Midwife

## 2022-03-30 ENCOUNTER — Ambulatory Visit (INDEPENDENT_AMBULATORY_CARE_PROVIDER_SITE_OTHER): Payer: Medicaid Other | Admitting: Advanced Practice Midwife

## 2022-03-30 VITALS — BP 126/82 | HR 112 | Wt 153.5 lb

## 2022-03-30 DIAGNOSIS — Z363 Encounter for antenatal screening for malformations: Secondary | ICD-10-CM

## 2022-03-30 DIAGNOSIS — Z1379 Encounter for other screening for genetic and chromosomal anomalies: Secondary | ICD-10-CM

## 2022-03-30 DIAGNOSIS — Z3402 Encounter for supervision of normal first pregnancy, second trimester: Secondary | ICD-10-CM

## 2022-03-30 DIAGNOSIS — Z3A16 16 weeks gestation of pregnancy: Secondary | ICD-10-CM

## 2022-03-30 NOTE — Progress Notes (Signed)
   LOW-RISK PREGNANCY VISIT Patient name: Kaitlyn Meyer MRN [id number]  Date of birth: [birth year] Chief Complaint:   Routine Prenatal Visit (2nd IT today)  History of Present Illness:   Kaitlyn Meyer is a 21 y.o. G43P0000 female at [redacted]w[redacted]d with an Estimated Date of Delivery: 09/10/22 being seen today for ongoing management of a low-risk pregnancy.  Today she reports nausea.  Zofran helps. Contractions: Not present. Vag. Bleeding: None.  Movement: Absent. denies leaking of fluid. Review of Systems:   Pertinent items are noted in HPI Denies abnormal vaginal discharge w/ itching/odor/irritation, headaches, visual changes, shortness of breath, chest pain, abdominal pain, severe nausea/vomiting, or problems with urination or bowel movements unless otherwise stated above. Pertinent History Reviewed:  Reviewed past medical,surgical, social, obstetrical and family history.  Reviewed problem list, medications and allergies. Physical Assessment:   Vitals:   03/30/22 0917  BP: 126/82  Pulse: (!) 112  Weight: 153 lb 8 oz (69.6 kg)  Body mass index is 29 kg/m.        Physical Examination:   General appearance: Well appearing, and in no distress  Mental status: Alert, oriented to person, place, and time  Skin: Warm & dry  Cardiovascular: Normal heart rate noted  Respiratory: Normal respiratory effort, no distress  Abdomen: Soft, gravid, nontender  Pelvic: Cervical exam deferred         Extremities: Edema: None  Fetal Status:     Movement: Absent    Chaperone: n/a    No results found for this or any previous visit (from the past 24 hour(s)).  Assessment & Plan:  1) Low-risk pregnancy G1P0000 at [redacted]w[redacted]d with an Estimated Date of Delivery: 09/10/22     Meds: No orders of the defined types were placed in this encounter.  Labs/procedures today: 2nd IT  Plan:  Continue routine obstetrical care  Next visit: prefers in person    Reviewed: Preterm labor symptoms and general obstetric  precautions including but not limited to vaginal bleeding, contractions, leaking of fluid and fetal movement were reviewed in detail with the patient.  All questions were answered. Has home bp cuff.. Check bp weekly, let us know if >140/90.   Follow-up: Return 2-3 weeks, for [id number], LROB.  Orders Placed This Encounter  Procedures   US OB Comp + 14 Wk   INTEGRATED 2   Christin Fudge DNP, CNM 03/30/2022 9:38 AM

## 2022-03-30 NOTE — Patient Instructions (Signed)
Kaitlyn Meyer, I greatly value your feedback.  If you receive a survey following your visit with [place] today, we appreciate you taking the time to fill it out.  Thanks, [name], CNM     [facility] HAS MOVED!!! It is now [facility] ([address] [place], [place] [address]) Entrance located off of [address] Free 24/7 valet parking   Go to [email or web address] to register for FREE online childbirth classes    Second Trimester of Pregnancy The second trimester is from week 14 through week 27 (months 4 through 6). The second trimester is often a time when you feel your best. Your body has adjusted to being pregnant, and you begin to feel better physically. Usually, morning sickness has lessened or quit completely, you may have more energy, and you may have an increase in appetite. The second trimester is also a time when the fetus is growing rapidly. At the end of the sixth month, the fetus is about 9 inches long and weighs about 1 pounds. You will likely begin to feel the baby move (quickening) between 16 and 20 weeks of pregnancy. Body changes during your second trimester Your body continues to go through many changes during your second trimester. The changes vary from woman to woman. Your weight will continue to increase. You will notice your lower abdomen bulging out. You may begin to get stretch marks on your hips, abdomen, and breasts. You may develop headaches that can be relieved by medicines. The medicines should be approved by your health care provider. You may urinate more often because the fetus is pressing on your bladder. You may develop or continue to have heartburn as a result of your pregnancy. You may develop constipation because certain hormones are causing the muscles that push waste through your intestines to slow down. You may develop hemorrhoids or swollen, bulging veins (varicose veins). You may have back pain. This  is caused by: Weight gain. Pregnancy hormones that are relaxing the joints in your pelvis. A shift in weight and the muscles that support your balance. Your breasts will continue to grow and they will continue to become tender. Your gums may bleed and may be sensitive to brushing and flossing. Dark spots or blotches (chloasma, mask of pregnancy) may develop on your face. This will likely fade after the baby is born. A dark line from your belly button to the pubic area (linea nigra) may appear. This will likely fade after the baby is born. You may have changes in your hair. These can include thickening of your hair, rapid growth, and changes in texture. Some women also have hair loss during or after pregnancy, or hair that feels dry or thin. Your hair will most likely return to normal after your baby is born.  What to expect at prenatal visits During a routine prenatal visit: You will be weighed to make sure you and the fetus are growing normally. Your blood pressure will be taken. Your abdomen will be measured to track your baby's growth. The fetal heartbeat will be listened to. Any test results from the previous visit will be discussed.  Your health care provider may ask you: How you are feeling. If you are feeling the baby move. If you have had any abnormal symptoms, such as leaking fluid, bleeding, severe headaches, or abdominal cramping. If you are using any tobacco products, including cigarettes, chewing tobacco, and electronic cigarettes. If you have any questions.  Other tests  that may be performed during your second trimester include: Blood tests that check for: Low iron levels (anemia). High blood sugar that affects pregnant women (gestational diabetes) between 54 and 28 weeks. Rh antibodies. This is to check for a protein on red blood cells (Rh factor). Urine tests to check for infections, diabetes, or protein in the urine. An ultrasound to confirm the proper growth and  development of the baby. An amniocentesis to check for possible genetic problems. Fetal screens for spina bifida and Down syndrome. HIV (human immunodeficiency virus) testing. Routine prenatal testing includes screening for HIV, unless you choose not to have this test.  Follow these instructions at home: Medicines Follow your health care provider's instructions regarding medicine use. Specific medicines may be either safe or unsafe to take during pregnancy. Take a prenatal vitamin that contains at least 600 micrograms (mcg) of folic acid. If you develop constipation, try taking a stool softener if your health care provider approves. Eating and drinking Eat a balanced diet that includes fresh fruits and vegetables, whole grains, good sources of protein such as meat, eggs, or tofu, and low-fat dairy. Your health care provider will help you determine the amount of weight gain that is right for you. Avoid raw meat and uncooked cheese. These carry germs that can cause birth defects in the baby. If you have low calcium intake from food, talk to your health care provider about whether you should take a daily calcium supplement. Limit foods that are high in fat and processed sugars, such as fried and sweet foods. To prevent constipation: Drink enough fluid to keep your urine clear or pale yellow. Eat foods that are high in fiber, such as fresh fruits and vegetables, whole grains, and beans. Activity Exercise only as directed by your health care provider. Most women can continue their usual exercise routine during pregnancy. Try to exercise for 30 minutes at least 5 days a week. Stop exercising if you experience uterine contractions. Avoid heavy lifting, wear low heel shoes, and practice good posture. A sexual relationship may be continued unless your health care provider directs you otherwise. Relieving pain and discomfort Wear a good support bra to prevent discomfort from breast tenderness. Take  warm sitz baths to soothe any pain or discomfort caused by hemorrhoids. Use hemorrhoid cream if your health care provider approves. Rest with your legs elevated if you have leg cramps or low back pain. If you develop varicose veins, wear support hose. Elevate your feet for 15 minutes, 3-4 times a day. Limit salt in your diet. Prenatal Care Write down your questions. Take them to your prenatal visits. Keep all your prenatal visits as told by your health care provider. This is important. Safety Wear your seat belt at all times when driving. Make a list of emergency phone numbers, including numbers for family, friends, the hospital, and police and fire departments. General instructions Ask your health care provider for a referral to a local prenatal education class. Begin classes no later than the beginning of month 6 of your pregnancy. Ask for help if you have counseling or nutritional needs during pregnancy. Your health care provider can offer advice or refer you to specialists for help with various needs. Do not use hot tubs, steam rooms, or saunas. Do not douche or use tampons or scented sanitary pads. Do not cross your legs for long periods of time. Avoid cat litter boxes and soil used by cats. These carry germs that can cause birth defects in the baby  and possibly loss of the fetus by miscarriage or stillbirth. Avoid all smoking, herbs, alcohol, and unprescribed drugs. Chemicals in these products can affect the formation and growth of the baby. Do not use any products that contain nicotine or tobacco, such as cigarettes and e-cigarettes. If you need help quitting, ask your health care provider. Visit your dentist if you have not gone yet during your pregnancy. Use a soft toothbrush to brush your teeth and be gentle when you floss. Contact a health care provider if: You have dizziness. You have mild pelvic cramps, pelvic pressure, or nagging pain in the abdominal area. You have persistent  nausea, vomiting, or diarrhea. You have a bad smelling vaginal discharge. You have pain when you urinate. Get help right away if: You have a fever. You are leaking fluid from your vagina. You have spotting or bleeding from your vagina. You have severe abdominal cramping or pain. You have rapid weight gain or weight loss. You have shortness of breath with chest pain. You notice sudden or extreme swelling of your face, hands, ankles, feet, or legs. You have not felt your baby move in over an hour. You have severe headaches that do not go away when you take medicine. You have vision changes. Summary The second trimester is from week 14 through week 27 (months 4 through 6). It is also a time when the fetus is growing rapidly. Your body goes through many changes during pregnancy. The changes vary from woman to woman. Avoid all smoking, herbs, alcohol, and unprescribed drugs. These chemicals affect the formation and growth your baby. Do not use any tobacco products, such as cigarettes, chewing tobacco, and e-cigarettes. If you need help quitting, ask your health care provider. Contact your health care provider if you have any questions. Keep all prenatal visits as told by your health care provider. This is important. This information is not intended to replace advice given to you by your health care provider. Make sure you discuss any questions you have with your health care provider.

## 2022-04-01 LAB — INTEGRATED 2
AFP MoM: 1.04
Alpha-Fetoprotein: 37.2 ng/mL
Crown Rump Length: 67.1 mm
DIA MoM: 1.28
DIA Value: 189.6 pg/mL
Estriol, Unconjugated: 1.09 ng/mL
Gest. Age on Collection Date: 12.9 weeks
Gestational Age: 16.9 weeks
Maternal Age at EDD: 22.3 yr
Nuchal Translucency (NT): 1.5 mm
Nuchal Translucency MoM: 0.99
Number of Fetuses: 1
PAPP-A MoM: 0.96
PAPP-A Value: 1118.2 ng/mL
Test Results:: NEGATIVE
Weight: 150 [lb_av]
Weight: 154 [lb_av]
hCG MoM: 1.23
hCG Value: 37.8 IU/mL
uE3 MoM: 0.99

## 2022-05-05 ENCOUNTER — Ambulatory Visit (INDEPENDENT_AMBULATORY_CARE_PROVIDER_SITE_OTHER): Payer: Medicaid Other | Admitting: Advanced Practice Midwife

## 2022-05-05 ENCOUNTER — Ambulatory Visit (INDEPENDENT_AMBULATORY_CARE_PROVIDER_SITE_OTHER): Payer: Medicaid Other

## 2022-05-05 VITALS — BP 121/74 | HR 97 | Wt 164.5 lb

## 2022-05-05 DIAGNOSIS — Z3402 Encounter for supervision of normal first pregnancy, second trimester: Secondary | ICD-10-CM

## 2022-05-05 DIAGNOSIS — Z363 Encounter for antenatal screening for malformations: Secondary | ICD-10-CM

## 2022-05-05 DIAGNOSIS — Z3A21 21 weeks gestation of pregnancy: Secondary | ICD-10-CM

## 2022-05-05 DIAGNOSIS — Z2839 Other underimmunization status: Secondary | ICD-10-CM

## 2022-05-05 DIAGNOSIS — O09899 Supervision of other high risk pregnancies, unspecified trimester: Secondary | ICD-10-CM | POA: Insufficient documentation

## 2022-05-05 NOTE — Patient Instructions (Signed)
Kaitlyn Meyer, I greatly value your feedback.  If you receive a survey following your visit with [place] today, we appreciate you taking the time to fill it out.  Thanks, Philipp Deputy, CNM   You will have your sugar test next visit.  Please do not eat or drink anything after midnight the night before you come, not even water.  You will be here for at least two hours.  Please make an appointment online for the bloodwork at [email or web address] for 8:30am (or as close to this as possible). Make sure you select the [facility] service center. The day of the appointment, check in with our office first, then you will go to [facility] to start the sugar test.    [facility] HAS MOVED!!! It is now [facility] at [facility] ([address] [place], [place] [address]) Entrance C, located off of [address] valet parking  Go to [email or web address] to register for FREE online childbirth classes   Call the office [phone]) or go to [facility] if: You begin to have strong, frequent contractions Your water breaks.  Sometimes it is a big gush of fluid, sometimes it is just a trickle that keeps getting your panties wet or running down your legs You have vaginal bleeding.  It is normal to have a small amount of spotting if your cervix was checked.  You don't feel your baby moving like normal.  If you don't, get you something to eat and drink and lay down and focus on feeling your baby move.   If your baby is still not moving like normal, you should call the office or go to [facility].  [place] Pediatricians/Family Doctors: [facility] [phone]           [facility] [phone]                [facility] [phone] (usually not accepting new patients unless you have family there already, you are always welcome to call and ask)      [facility] Department [phone]       [facility] Pediatricians/Family Doctors:  [facility]: [phone] [facility]: [phone] [facility]: [phone]  [facility] Doctors:  [facility]: [phone]  [facility]: [phone]  [facility] Doctors: [facility]: [phone]   Home Blood Pressure Monitoring for Patients   Your provider has recommended that you check your blood pressure (BP) at least once a week at home. If you do not have a blood pressure cuff at home, one will be provided for you. Contact your provider if you have not received your monitor within 1 week.   Helpful Tips for Accurate Home Blood Pressure Checks  Don't smoke, exercise, or drink caffeine 30 minutes before checking your BP Use the restroom before checking your BP (a full bladder can raise your pressure) Relax in a comfortable upright chair Feet on the ground Left arm resting comfortably on a flat surface at the level of your heart Legs uncrossed Back supported Sit quietly and don't talk Place the cuff on your bare arm Adjust snuggly, so that only two fingertips can fit between your skin and the top of the cuff Check 2 readings separated by at least one minute Keep a log of your BP readings For a visual, please reference this diagram: http://ccnc.care/bpdiagram  Provider Name: [facility]     Phone: [phone]  Zone 1: ALL CLEAR  Continue to monitor your symptoms:  BP reading is less than 140 (top number) or less than 90 (bottom number)  No right upper stomach pain No headaches or seeing spots No feeling nauseated or throwing up No swelling in face and hands  Zone 2: CAUTION Call your doctor's office for any of the following:  BP reading is greater than 140 (top number) or greater than 90 (bottom number)  Stomach pain under your ribs in the middle or right side Headaches or seeing spots Feeling nauseated or throwing up Swelling in face and hands  Zone 3: EMERGENCY   Seek immediate medical care if you have any of the following:  BP reading is greater than160 (top number) or greater than 110 (bottom number) Severe headaches not improving with Tylenol Serious difficulty catching your breath Any worsening symptoms from Zone 2   Second Trimester of Pregnancy The second trimester is from week 13 through week 28, months 4 through 6. The second trimester is often a time when you feel your best. Your body has also adjusted to being pregnant, and you begin to feel better physically. Usually, morning sickness has lessened or quit completely, you may have more energy, and you may have an increase in appetite. The second trimester is also a time when the fetus is growing rapidly. At the end of the sixth month, the fetus is about 9 inches long and weighs about 1 pounds. You will likely begin to feel the baby move (quickening) between 18 and 20 weeks of the pregnancy. BODY CHANGES Your body goes through many changes during pregnancy. The changes vary from woman to woman.  Your weight will continue to increase. You will notice your lower abdomen bulging out. You may begin to get stretch marks on your hips, abdomen, and breasts. You may develop headaches that can be relieved by medicines approved by your health care provider. You may urinate more often because the fetus is pressing on your bladder. You may develop or continue to have heartburn as a result of your pregnancy. You may develop constipation because certain hormones are causing the muscles that push waste through your intestines to slow down. You may develop hemorrhoids or swollen, bulging veins (varicose veins). You may have back pain because of the weight gain and pregnancy hormones relaxing your joints between the bones in your pelvis and as a result of a shift in weight and the muscles that support your balance. Your breasts will continue to grow and be tender. Your gums may bleed and may be sensitive to  brushing and flossing. Dark spots or blotches (chloasma, mask of pregnancy) may develop on your face. This will likely fade after the baby is born. A dark line from your belly button to the pubic area (linea nigra) may appear. This will likely fade after the baby is born. You may have changes in your hair. These can include thickening of your hair, rapid growth, and changes in texture. Some women also have hair loss during or after pregnancy, or hair that feels dry or thin. Your hair will most likely return to normal after your baby is born. WHAT TO EXPECT AT YOUR PRENATAL VISITS During a routine prenatal visit: You will be weighed to make sure you and the fetus are growing normally. Your blood pressure will be taken. Your abdomen will be measured to track your baby's growth. The fetal heartbeat will be listened to. Any test results from the previous visit will be discussed. Your health care provider  may ask you: How you are feeling. If you are feeling the baby move. If you have had any abnormal symptoms, such as leaking fluid, bleeding, severe headaches, or abdominal cramping. If you have any questions. Other tests that may be performed during your second trimester include: Blood tests that check for: Low iron levels (anemia). Gestational diabetes (between 24 and 28 weeks). Rh antibodies. Urine tests to check for infections, diabetes, or protein in the urine. An ultrasound to confirm the proper growth and development of the baby. An amniocentesis to check for possible genetic problems. Fetal screens for spina bifida and Down syndrome. HOME CARE INSTRUCTIONS  Avoid all smoking, herbs, alcohol, and unprescribed drugs. These chemicals affect the formation and growth of the baby. Follow your health care provider's instructions regarding medicine use. There are medicines that are either safe or unsafe to take during pregnancy. Exercise only as directed by your health care provider.  Experiencing uterine cramps is a good sign to stop exercising. Continue to eat regular, healthy meals. Wear a good support bra for breast tenderness. Do not use hot tubs, steam rooms, or saunas. Wear your seat belt at all times when driving. Avoid raw meat, uncooked cheese, cat litter boxes, and soil used by cats. These carry germs that can cause birth defects in the baby. Take your prenatal vitamins. Try taking a stool softener (if your health care provider approves) if you develop constipation. Eat more high-fiber foods, such as fresh vegetables or fruit and whole grains. Drink plenty of fluids to keep your urine clear or pale yellow. Take warm sitz baths to soothe any pain or discomfort caused by hemorrhoids. Use hemorrhoid cream if your health care provider approves. If you develop varicose veins, wear support hose. Elevate your feet for 15 minutes, 3-4 times a day. Limit salt in your diet. Avoid heavy lifting, wear low heel shoes, and practice good posture. Rest with your legs elevated if you have leg cramps or low back pain. Visit your dentist if you have not gone yet during your pregnancy. Use a soft toothbrush to brush your teeth and be gentle when you floss. A sexual relationship may be continued unless your health care provider directs you otherwise. Continue to go to all your prenatal visits as directed by your health care provider. SEEK MEDICAL CARE IF:  You have dizziness. You have mild pelvic cramps, pelvic pressure, or nagging pain in the abdominal area. You have persistent nausea, vomiting, or diarrhea. You have a bad smelling vaginal discharge. You have pain with urination. SEEK IMMEDIATE MEDICAL CARE IF:  You have a fever. You are leaking fluid from your vagina. You have spotting or bleeding from your vagina. You have severe abdominal cramping or pain. You have rapid weight gain or loss. You have shortness of breath with chest pain. You notice sudden or extreme swelling  of your face, hands, ankles, feet, or legs. You have not felt your baby move in over an hour. You have severe headaches that do not go away with medicine. You have vision changes. Document Released: 10/10/2001 Document Revised: 10/21/2013 Document Reviewed: 12/17/2012 [facility] Patient Information 2015 [place], [place]. This information is not intended to replace advice given to you by your health care provider. Make sure you discuss any questions you have with your health care provider.

## 2022-05-05 NOTE — Progress Notes (Signed)
   LOW-RISK PREGNANCY VISIT Patient name: Kaitlyn Meyer MRN [id number]  Date of birth: 02-26-2001 Chief Complaint:   Routine Prenatal Visit  History of Present Illness:   Kaitlyn Meyer is a 21 y.o. G90P0000 female at [redacted]w[redacted]d with an Estimated Date of Delivery: 09/10/22 being seen today for ongoing management of a low-risk pregnancy.  Today she reports no complaints. Contractions: Not present. Vag. Bleeding: None.  Movement: Present. denies leaking of fluid. Review of Systems:   Pertinent items are noted in HPI Denies abnormal vaginal discharge w/ itching/odor/irritation, headaches, visual changes, shortness of breath, chest pain, abdominal pain, severe nausea/vomiting, or problems with urination or bowel movements unless otherwise stated above. Pertinent History Reviewed:  Reviewed past medical,surgical, social, obstetrical and family history.  Reviewed problem list, medications and allergies. Physical Assessment:   Vitals:   05/05/22 1138  BP: 121/74  Pulse: 97  Weight: 164 lb 8 oz (74.6 kg)  Body mass index is 31.08 kg/m.        Physical Examination:   General appearance: Well appearing, and in no distress  Mental status: Alert, oriented to person, place, and time  Skin: Warm & dry  Cardiovascular: Normal heart rate noted  Respiratory: Normal respiratory effort, no distress  Abdomen: Soft, gravid, nontender  Pelvic: Cervical exam deferred         Extremities: Edema: None  Fetal Status: Fetal Heart Rate (bpm): 152 u/s   Movement: Present    Anatomy u/s: [place] 21+5 wks,breech,anterior placenta gr 0,normal ovaries,FHR 152 bpm,SVP of fluid 5.2 cm,EFW 408 g 21%,anatomy complete,no obvious abnormalities  No results found for this or any previous visit (from the past 24 hour(s)).  Assessment & Plan:  1) Low-risk pregnancy G1P0000 at [redacted]w[redacted]d with an Estimated Date of Delivery: 09/10/22     Meds: No orders of the defined types were placed in this encounter.  Labs/procedures  today: anatomy u/s  Plan:  Continue routine obstetrical care   Reviewed: Preterm labor symptoms and general obstetric precautions including but not limited to vaginal bleeding, contractions, leaking of fluid and fetal movement were reviewed in detail with the patient.  All questions were answered. Didn't ask about home bp cuff. Check bp weekly, let us know if >140/90.   Follow-up: Return in about 1 month (around 06/05/2022) for LROB, PN2, in person.  No orders of the defined types were placed in this encounter.  Arabella Merles CNM 05/05/2022 11:59 AM

## 2022-05-05 NOTE — Progress Notes (Signed)
[place] 21+5 wks,breech,anterior placenta gr 0,normal ovaries,FHR 152 bpm,SVP of fluid 5.2 cm,EFW 408 g 21%,anatomy complete,no obvious abnormalities

## 2022-05-15 ENCOUNTER — Encounter: Payer: Self-pay | Admitting: Advanced Practice Midwife

## 2022-06-05 ENCOUNTER — Other Ambulatory Visit: Payer: Medicaid Other

## 2022-06-05 ENCOUNTER — Encounter: Payer: Self-pay | Admitting: Women's Health

## 2022-06-05 ENCOUNTER — Ambulatory Visit (INDEPENDENT_AMBULATORY_CARE_PROVIDER_SITE_OTHER): Payer: Medicaid Other | Admitting: Women's Health

## 2022-06-05 VITALS — BP 111/76 | HR 115 | Wt 174.0 lb

## 2022-06-05 DIAGNOSIS — Z3483 Encounter for supervision of other normal pregnancy, third trimester: Secondary | ICD-10-CM

## 2022-06-05 DIAGNOSIS — Z3A25 25 weeks gestation of pregnancy: Secondary | ICD-10-CM

## 2022-06-05 DIAGNOSIS — Z3402 Encounter for supervision of normal first pregnancy, second trimester: Secondary | ICD-10-CM

## 2022-06-05 DIAGNOSIS — Z131 Encounter for screening for diabetes mellitus: Secondary | ICD-10-CM

## 2022-06-05 NOTE — Patient Instructions (Signed)
Zyann, thank you for choosing our office today! We appreciate the opportunity to meet your healthcare needs. You may receive a short survey by mail, e-mail, or through MyChart. If you are happy with your care we would appreciate if you could take just a few minutes to complete the survey questions. We read all of your comments and take your feedback very seriously. Thank you again for choosing our office.  [facility] ([address] [place], [place] [address]) Entrance C, located off of [address] Free 24/7 valet parking   CLASSES: Go to [email or web address] to register for classes (childbirth, breastfeeding, waterbirth, infant CPR, daddy bootcamp, etc.)  Call the office ([phone]) or go to [facility] if: You begin to have strong, frequent contractions Your water breaks.  Sometimes it is a big gush of fluid, sometimes it is just a trickle that keeps getting your panties wet or running down your legs You have vaginal bleeding.  It is normal to have a small amount of spotting if your cervix was checked.  You don't feel your baby moving like normal.  If you don't, get you something to eat and drink and lay down and focus on feeling your baby move.   If your baby is still not moving like normal, you should call the office or go to [facility].  Call the office ([phone]) or go to [facility] for these signs of pre-eclampsia: Severe headache that does not go away with Tylenol Visual changes- seeing spots, double, blurred vision Pain under your right breast or upper abdomen that does not go away with Tums or heartburn medicine Nausea and/or vomiting Severe swelling in your hands, feet, and face   Tdap Vaccine It is recommended that you get the Tdap vaccine during the third trimester of EACH pregnancy to help protect your baby from getting pertussis (whooping cough) 27-36 weeks is the BEST time to do  this so that you can pass the protection on to your baby. During pregnancy is better than after pregnancy, but if you are unable to get it during pregnancy it will be offered at the hospital.  You can get this vaccine with us, at the health department, your family doctor, or some local pharmacies Everyone who will be around your baby should also be up-to-date on their vaccines before the baby comes. Adults (who are not pregnant) only need 1 dose of Tdap during adulthood.   [place] Pediatricians/Family Doctors [facility] ([facility]): [address], [phone]           [facility]: [address], [phone]                [facility] ([facility]): [address], [phone] (call to ask if accepting patients) [facility]: [address], [place], [phone]    [place] Pediatricians/Family Doctors [facility] ([facility]): [address], [phone] [facility]: [address], [phone] [facility]: [address], [phone]  [place] [facility]  [facility] ([facility]): [phone] [facility]: [address], [phone]   [place] Family Doctors [facility]: [address], [phone]  [place] [facility]  [facility]: [address], [phone]  Home Blood Pressure Monitoring for Patients   Your provider has recommended that you check your   blood pressure (BP) at least once a week at home. If you do not have a blood pressure cuff at home, one will be provided for you. Contact your provider if you have not received your monitor within 1 week.   Helpful Tips for Accurate Home Blood Pressure Checks  Don't smoke, exercise, or drink caffeine 30 minutes before checking your BP Use the restroom before checking your BP (a full bladder can raise your  pressure) Relax in a comfortable upright chair Feet on the ground Left arm resting comfortably on a flat surface at the level of your heart Legs uncrossed Back supported Sit quietly and don't talk Place the cuff on your bare arm Adjust snuggly, so that only two fingertips can fit between your skin and the top of the cuff Check 2 readings separated by at least one minute Keep a log of your BP readings For a visual, please reference this diagram: http://ccnc.care/bpdiagram  Provider Name: [facility]     Phone: [phone]  Zone 1: ALL CLEAR  Continue to monitor your symptoms:  BP reading is less than 140 (top number) or less than 90 (bottom number)  No right upper stomach pain No headaches or seeing spots No feeling nauseated or throwing up No swelling in face and hands  Zone 2: CAUTION Call your doctor's office for any of the following:  BP reading is greater than 140 (top number) or greater than 90 (bottom number)  Stomach pain under your ribs in the middle or right side Headaches or seeing spots Feeling nauseated or throwing up Swelling in face and hands  Zone 3: EMERGENCY  Seek immediate medical care if you have any of the following:  BP reading is greater than160 (top number) or greater than 110 (bottom number) Severe headaches not improving with Tylenol Serious difficulty catching your breath Any worsening symptoms from Zone 2   Third Trimester of Pregnancy The third trimester is from week 29 through week 42, months 7 through 9. The third trimester is a time when the fetus is growing rapidly. At the end of the ninth month, the fetus is about 20 inches in length and weighs 6-10 pounds.  BODY CHANGES Your body goes through many changes during pregnancy. The changes vary from woman to woman.  Your weight will continue to increase. You can expect to gain 25-35 pounds (11-16 kg) by the end of the pregnancy. You may begin to get stretch marks on your hips, abdomen,  and breasts. You may urinate more often because the fetus is moving lower into your pelvis and pressing on your bladder. You may develop or continue to have heartburn as a result of your pregnancy. You may develop constipation because certain hormones are causing the muscles that push waste through your intestines to slow down. You may develop hemorrhoids or swollen, bulging veins (varicose veins). You may have pelvic pain because of the weight gain and pregnancy hormones relaxing your joints between the bones in your pelvis. Backaches may result from overexertion of the muscles supporting your posture. You may have changes in your hair. These can include thickening of your hair, rapid growth, and changes in texture. Some women also have hair loss during or after pregnancy, or hair that feels dry or thin. Your hair will most likely return to normal after your baby is born. Your breasts will continue to grow and be tender. A yellow discharge may leak from your breasts called colostrum. Your belly button may stick out. You may   feel short of breath because of your expanding uterus. You may notice the fetus "dropping," or moving lower in your abdomen. You may have a bloody mucus discharge. This usually occurs a few days to a week before labor begins. Your cervix becomes thin and soft (effaced) near your due date. WHAT TO EXPECT AT YOUR PRENATAL EXAMS  You will have prenatal exams every 2 weeks until week 36. Then, you will have weekly prenatal exams. During a routine prenatal visit: You will be weighed to make sure you and the fetus are growing normally. Your blood pressure is taken. Your abdomen will be measured to track your baby's growth. The fetal heartbeat will be listened to. Any test results from the previous visit will be discussed. You may have a cervical check near your due date to see if you have effaced. At around 36 weeks, your caregiver will check your cervix. At the same time, your  caregiver will also perform a test on the secretions of the vaginal tissue. This test is to determine if a type of bacteria, Group B streptococcus, is present. Your caregiver will explain this further. Your caregiver may ask you: What your birth plan is. How you are feeling. If you are feeling the baby move. If you have had any abnormal symptoms, such as leaking fluid, bleeding, severe headaches, or abdominal cramping. If you have any questions. Other tests or screenings that may be performed during your third trimester include: Blood tests that check for low iron levels (anemia). Fetal testing to check the health, activity level, and growth of the fetus. Testing is done if you have certain medical conditions or if there are problems during the pregnancy. FALSE LABOR You may feel small, irregular contractions that eventually go away. These are called Braxton Hicks contractions, or false labor. Contractions may last for hours, days, or even weeks before true labor sets in. If contractions come at regular intervals, intensify, or become painful, it is best to be seen by your caregiver.  SIGNS OF LABOR  Menstrual-like cramps. Contractions that are 5 minutes apart or less. Contractions that start on the top of the uterus and spread down to the lower abdomen and back. A sense of increased pelvic pressure or back pain. A watery or bloody mucus discharge that comes from the vagina. If you have any of these signs before the 37th week of pregnancy, call your caregiver right away. You need to go to the hospital to get checked immediately. HOME CARE INSTRUCTIONS  Avoid all smoking, herbs, alcohol, and unprescribed drugs. These chemicals affect the formation and growth of the baby. Follow your caregiver's instructions regarding medicine use. There are medicines that are either safe or unsafe to take during pregnancy. Exercise only as directed by your caregiver. Experiencing uterine cramps is a good sign to  stop exercising. Continue to eat regular, healthy meals. Wear a good support bra for breast tenderness. Do not use hot tubs, steam rooms, or saunas. Wear your seat belt at all times when driving. Avoid raw meat, uncooked cheese, cat litter boxes, and soil used by cats. These carry germs that can cause birth defects in the baby. Take your prenatal vitamins. Try taking a stool softener (if your caregiver approves) if you develop constipation. Eat more high-fiber foods, such as fresh vegetables or fruit and whole grains. Drink plenty of fluids to keep your urine clear or pale yellow. Take warm sitz baths to soothe any pain or discomfort caused by hemorrhoids. Use hemorrhoid cream if   your caregiver approves. If you develop varicose veins, wear support hose. Elevate your feet for 15 minutes, 3-4 times a day. Limit salt in your diet. Avoid heavy lifting, wear low heal shoes, and practice good posture. Rest a lot with your legs elevated if you have leg cramps or low back pain. Visit your dentist if you have not gone during your pregnancy. Use a soft toothbrush to brush your teeth and be gentle when you floss. A sexual relationship may be continued unless your caregiver directs you otherwise. Do not travel far distances unless it is absolutely necessary and only with the approval of your caregiver. Take prenatal classes to understand, practice, and ask questions about the labor and delivery. Make a trial run to the hospital. Pack your hospital bag. Prepare the baby's nursery. Continue to go to all your prenatal visits as directed by your caregiver. SEEK MEDICAL CARE IF: You are unsure if you are in labor or if your water has broken. You have dizziness. You have mild pelvic cramps, pelvic pressure, or nagging pain in your abdominal area. You have persistent nausea, vomiting, or diarrhea. You have a bad smelling vaginal discharge. You have pain with urination. SEEK IMMEDIATE MEDICAL CARE IF:  You  have a fever. You are leaking fluid from your vagina. You have spotting or bleeding from your vagina. You have severe abdominal cramping or pain. You have rapid weight loss or gain. You have shortness of breath with chest pain. You notice sudden or extreme swelling of your face, hands, ankles, feet, or legs. You have not felt your baby move in over an hour. You have severe headaches that do not go away with medicine. You have vision changes. Document Released: 10/10/2001 Document Revised: 10/21/2013 Document Reviewed: 12/17/2012 ExitCare Patient Information 2015 [employer]. This information is not intended to replace advice given to you by your health care provider. Make sure you discuss any questions you have with your health care provider.       

## 2022-06-05 NOTE — Progress Notes (Signed)
LOW-RISK PREGNANCY VISIT Patient name: Kaitlyn Meyer MRN [id number]  Date of birth: [birth year] Chief Complaint:   Routine Prenatal Visit  History of Present Illness:   Kaitlyn Meyer is a 21 y.o. G75P0000 female at [redacted]w[redacted]d with an Estimated Date of Delivery: 09/10/22 being seen today for ongoing management of a low-risk pregnancy.   Today she reports heartburn/reflux. Contractions: Not present. Vag. Bleeding: None.  Movement: Present. denies leaking of fluid.     03/02/2022    9:38 AM 06/27/2021    1:41 PM 03/24/2021   12:03 PM 06/04/2018    3:41 PM  Depression screen PHQ 2/9  Decreased Interest 0 3 2 3   Down, Depressed, Hopeless 0 1 2 3   PHQ - 2 Score 0 4 4 6   Altered sleeping 2 3 2 3   Tired, decreased energy 2 3 2 3   Change in appetite 2 3 2 3   Feeling bad or failure about yourself  0 1 2 3   Trouble concentrating 0 3 2 3   Moving slowly or fidgety/restless 0 2 2 3   Suicidal thoughts 0 0 0 3  PHQ-9 Score 6 19 16 27   Difficult doing work/chores    Extremely dIfficult        03/02/2022    9:38 AM 06/27/2021    1:42 PM 03/24/2021   12:04 PM  GAD 7 : Generalized Anxiety Score  Nervous, Anxious, on Edge 0 3 1  Control/stop worrying 0 3 1  Worry too much - different things 0 3 1  Trouble relaxing 0 3 1  Restless 0 2 1  Easily annoyed or irritable 0 3 1  Afraid - awful might happen 0 3 1  Total GAD 7 Score 0 20 7      Review of Systems:   Pertinent items are noted in HPI Denies abnormal vaginal discharge w/ itching/odor/irritation, headaches, visual changes, shortness of breath, chest pain, abdominal pain, severe nausea/vomiting, or problems with urination or bowel movements unless otherwise stated above. Pertinent History Reviewed:  Reviewed past medical,surgical, social, obstetrical and family history.  Reviewed problem list, medications and allergies. Physical Assessment:   Vitals:   06/05/22 0907  BP: 111/76  Pulse: (!) 115  Weight: 174 lb (78.9 kg)  Body  mass index is 32.88 kg/m.        Physical Examination:   General appearance: Well appearing, and in no distress  Mental status: Alert, oriented to person, place, and time  Skin: Warm & dry  Cardiovascular: Normal heart rate noted  Respiratory: Normal respiratory effort, no distress  Abdomen: Soft, gravid, nontender  Pelvic: Cervical exam deferred         Extremities: Edema: None  Fetal Status: Fetal Heart Rate (bpm): 155 Fundal Height: 26 cm Movement: Present    Chaperone: N/A   No results found for this or any previous visit (from the past 24 hour(s)).  Assessment & Plan:  1) Low-risk pregnancy G1P0000 at [redacted]w[redacted]d with an Estimated Date of Delivery: 09/10/22   2) Reflux, TUMS   Meds: No orders of the defined types were placed in this encounter.  Labs/procedures today: PN2 and declines tdap today, plans next visit  Plan:  Continue routine obstetrical care  Next visit: prefers in person    Reviewed: Preterm labor symptoms and general obstetric precautions including but not limited to vaginal bleeding, contractions, leaking of fluid and fetal movement were reviewed in detail with the patient.  All questions were answered. Does have home bp cuff. Office  bp cuff given: not applicable. Check bp weekly, let us know if consistently >140 and/or >90.  Follow-up: Return in about 4 weeks (around 07/03/2022) for LROB, CNM, in person.  No future appointments.  No orders of the defined types were placed in this encounter.  Cheral Marker CNM, [facility] 06/05/2022 9:42 AM

## 2022-06-06 LAB — GLUCOSE TOLERANCE, 2 HOURS W/ 1HR
Glucose, 1 hour: 97 mg/dL (ref 70–179)
Glucose, 2 hour: 108 mg/dL (ref 70–152)
Glucose, Fasting: 81 mg/dL (ref 70–91)

## 2022-06-06 LAB — CBC
Hematocrit: 35.9 % (ref 34.0–46.6)
Hemoglobin: 11.8 g/dL (ref 11.1–15.9)
MCH: 29.7 pg (ref 26.6–33.0)
MCHC: 32.9 g/dL (ref 31.5–35.7)
MCV: 90 fL (ref 79–97)
Platelets: 249 10*3/uL (ref 150–450)
RBC: 3.97 x10E6/uL (ref 3.77–5.28)
RDW: 15.2 % (ref 11.7–15.4)
WBC: 10.5 10*3/uL (ref 3.4–10.8)

## 2022-06-06 LAB — HIV ANTIBODY (ROUTINE TESTING W REFLEX): HIV Screen 4th Generation wRfx: NONREACTIVE

## 2022-06-06 LAB — ANTIBODY SCREEN: Antibody Screen: NEGATIVE

## 2022-06-06 LAB — RPR: RPR Ser Ql: NONREACTIVE

## 2022-07-05 ENCOUNTER — Encounter: Payer: Self-pay | Admitting: Advanced Practice Midwife

## 2022-07-05 ENCOUNTER — Ambulatory Visit (INDEPENDENT_AMBULATORY_CARE_PROVIDER_SITE_OTHER): Payer: Medicaid Other | Admitting: Advanced Practice Midwife

## 2022-07-05 VITALS — BP 108/77 | HR 102 | Wt 183.0 lb

## 2022-07-05 DIAGNOSIS — Z3A30 30 weeks gestation of pregnancy: Secondary | ICD-10-CM

## 2022-07-05 DIAGNOSIS — Z3403 Encounter for supervision of normal first pregnancy, third trimester: Secondary | ICD-10-CM

## 2022-07-05 NOTE — Patient Instructions (Addendum)
Kaitlyn Meyer, I greatly value your feedback.  If you receive a survey following your visit with [place] today, we appreciate you taking the time to fill it out.  Thanks, [name], DNP, CNM  [facility] HAS MOVED!!! It is now [facility] at [facility] ([address] [place], [place] [address]) Entrance located off of [address] Free 24/7 valet parking   Go to [email or web address] to register for FREE online childbirth classes    Call the office [phone]) or go to [facility] if: You begin to have strong, frequent contractions Your water breaks.  Sometimes it is a big gush of fluid, sometimes it is just a trickle that keeps getting your panties wet or running down your legs You have vaginal bleeding.  It is normal to have a small amount of spotting if your cervix was checked.  You don't feel your baby moving like normal.  If you don't, get you something to eat and drink and lay down and focus on feeling your baby move.  You should feel at least 10 movements in 2 hours.  If you don't, you should call the office or go to [facility]   Home Blood Pressure Monitoring for Patients   Your provider has recommended that you check your blood pressure (BP) at least once a week at home. If you do not have a blood pressure cuff at home, one will be provided for you. Contact your provider if you have not received your monitor within 1 week.   Helpful Tips for Accurate Home Blood Pressure Checks  Don't smoke, exercise, or drink caffeine 30 minutes before checking your BP Use the restroom before checking your BP (a full bladder can raise your pressure) Relax in a comfortable upright chair Feet on the ground Left arm resting comfortably on a flat surface at the level of your heart Legs uncrossed Back supported Sit quietly and don't talk Place the cuff on your bare arm Adjust snuggly, so that only two fingertips can fit between your skin and the  top of the cuff Check 2 readings separated by at least one minute Keep a log of your BP readings For a visual, please reference this diagram: http://ccnc.care/bpdiagram  Provider Name: [facility]     Phone: [phone]  Zone 1: ALL CLEAR  Continue to monitor your symptoms:  BP reading is less than 140 (top number) or less than 90 (bottom number)  No right upper stomach pain No headaches or seeing spots No feeling nauseated or throwing up No swelling in face and hands  Zone 2: CAUTION Call your doctor's office for any of the following:  BP reading is greater than 140 (top number) or greater than 90 (bottom number)  Stomach pain under your ribs in the middle or right side Headaches or seeing spots Feeling nauseated or throwing up Swelling in face and hands  Zone 3: EMERGENCY  Seek immediate medical care if you have any of the following:  BP reading is greater than160 (top number) or greater than 110 (bottom number) Severe headaches not improving with Tylenol Serious difficulty catching your breath Any worsening symptoms from Zone 2   [phone] is the phone number for Pregnancy Classes or hospital tours at [facility].   You will be referred to  [email or web address]   for more information on childbirth classes   At this site you may register for classes. You may sign up for a waiting list if  classes are full. Please SIGN UP FOR THIS!.   When the waiting list becomes long, sometimes new classes can be added.  [facility] at [facility] Call to Register: [phone] or [phone]   or   Register Online: [email or web address] THESE CLASSES FILL UP VERY QUICKLY, SO SIGN UP AS SOON AS YOU CAN!!! Please visit [facility] pregnancy website at [email or web address]  Childbirth Classes  Option 1: Birth & Baby Series Series of 3 weekly classes, on the same day of  the week (can choose Mon-Thurs) from 6-9pm Helps you and your support person prepare for childbirth Reviews newborn care, labor & birth, cesarean birth, pain management, and comfort techniques Cost: $60 per couple for insured or self-pay, $30 per couple for Medicaid  Option 2: Weekend Birth & Baby This class is a weekend version of our Birth & Baby series.  It is designed for parents who have a difficult time fitting several weeks of classes into their schedule.   Covers the care of your newborn and the basics of labor and childbirth Friday 6:30pm-8:30pm Saturday 9am-4pm, includes lunch for you and your partner  Cost: $75 per couple for insured or self-pay, $30 per couple for Medicaid  Option 3: Natural Childbirth This series of 5 weekly classes is for expectant parents who want to learn and practice natural methods of coping with the process of labor and childbirth.  Can choose Mon or Tues, 7-9pm.   Covers relaxation, breathing, massage, visualization, role of the partner, and helpful positioning Participants learn how to be confident in their body's ability to give birth. Class empowers and helps parents make informed decisions about care. Includes discussion that will help new parents transition into the immediate postpartum period.  Cost: $75 per couple for insured or self-pay, $30 per couple for Medicaid  Option 4: Online Birth & Baby This online class offers you the freedom to complete a Birth & Baby series in the comfort of your own home.  The flexibility of this option allows you to review sections at your own pace, at times convenient to you and your support people.  It includes additional video information, animations, quizzes and extended activities. Get organized with helpful eClass tools, checklists, and trackers.  Cost: $60 for 60 days of online access                                                                            Other Available Classes  Baby & Me Enjoy this time  to discuss newborn & infant parenting topics and family adjustment issues with other new mothers in a relaxed environment. Each week brings a new speaker or baby-centered activity. We encourage mothers and their babies (birth to crawling) to join [place]. You are welcome to visit this group even if you haven't delivered yet! It's wonderful to make new friends early and watch other moms interact with their babies. No registration or fee.  Big Brother/Big Sister Let your children share in the joy of a new brother or sister in this special class designed just for them. Discussion includes how families care for babies: swaddling, holding, diapering, safety, as well as how they can be helpful in their new role. This class is designed  for children ages 2 to 74, but any age is welcome. Please register each child individually. $5 Breastfeeding Support Group This group is a mother-to-mother support circle where moms have the opportunity to share their breastfeeding experiences. A Breastfeeding Support nurse is present for questions and concerns. An infant scale is available for weight checks. No fee or registration.  Breastfeeding Your Baby Breastfeeding is a special time for mother and child. This class will help you feel ready to begin this important relationship. Your partner is encouraged to attend with you. Learn what to expect and feel more confident in the first days of breastfeeding your newborn. This class also addresses the most common fears and challenges of breastfeeding during the first few weeks, months, and beyond. $30 per couple Caring for Baby This class is for expectant and adoptive parents who want to learn and practice the most up-to-date newborn care for their babies. Focus is on birth through first six weeks of life. Topics include feeding, bathing, diapering, crying, umbilical cord care, circumcision care and safe sleep. Parents learn how to recognize symptoms of illness and when to call the  pediatrician. Register only the mom-to-be and your partner can plan to come with you. (*Note: This class is included in the Birth & Baby series and the Weekend Birth & Baby classes.) $10 per couple Comfort Techniques & Tour This 2-hour interactive class is designed for those who either do not wish to take the Birth & Baby series or for those who prefer our online childbirth class, but don't want to miss the opportunity to learn and practice hands-on techniques. These skills can help relieve some of the discomfort of labor and encourage your baby to rotate toward the best position for birth. You and your partner will be able to try a variety of labor positions with birth balls and rebozos as well as practice breathing, relaxation, and visual techniques. $20 per couple [employer] This course offers Dads-to-be the tools and knowledge needed to feel confident on their journey to becoming new fathers. Experienced dads, who have been trained as coaches, teach dads-to-be how to hold, comfort, diapers, swaddle and play with their infant while being able to support the new mom as well. $25 Grandparent Love Expecting a grandbaby? Learn about the latest infant care and safety recommendations and ways to support your own child as he or she transitions into the parenting role. $10 per person Infant and Child CPR Parents, grandparents, babysitters, and friends learn Cardio-Pulmonary Resuscitation skills for infants and children. You will also learn how to treat both conscious and unconscious choking infants and children. Register each participant individually. (Note: This Family & Friends program does not offer certification.) $20 per person Marvelous Multiples Expecting twins, triplets, or more? This free 2-hour class covers the differences in labor, birth, parenting, and breastfeeding issues that face multiples' parents.  Maternity Care Center Virtual Tour  Online virtual tour of the new [place] [facility] at [facility] Talk This free mom-led group offers support and connection to mothers as they journey through the adjustments and struggles of that sometimes overwhelming first year after the birth of a child. A member of our staff will be present to share resources and additional support if needed, as you care for yourself and baby. You are welcome to visit this group before you deliver! It's wonderful to meet new friends early and watch other moms interact with their babies.  Waterbirth Class Interested in a  waterbirth? This free informational class will help you discover whether waterbirth is the right fit for you and is required if you are planning a waterbirth. Education about waterbirth itself, supplies you may need, and what you may need from your support team is included in this class. Partners are encouraged to come.

## 2022-07-05 NOTE — Progress Notes (Signed)
   LOW-RISK PREGNANCY VISIT Patient name: Kaitlyn Meyer MRN [id number]  Date of birth: Jun 02, 2001 Chief Complaint:   Routine Prenatal Visit  History of Present Illness:   Kaitlyn Meyer is a 21 y.o. G72P0000 female at [redacted]w[redacted]d with an Estimated Date of Delivery: 09/10/22 being seen today for ongoing management of a low-risk pregnancy.  Today she reports  swelling in LE.  Marland Kitchen Contractions: Not present. Vag. Bleeding: None.  Movement: Present. denies leaking of fluid. Review of Systems:   Pertinent items are noted in HPI Denies abnormal vaginal discharge w/ itching/odor/irritation, headaches, visual changes, shortness of breath, chest pain, abdominal pain, severe nausea/vomiting, or problems with urination or bowel movements unless otherwise stated above. Pertinent History Reviewed:  Reviewed past medical,surgical, social, obstetrical and family history.  Reviewed problem list, medications and allergies. Physical Assessment:   Vitals:   07/05/22 0832  BP: 108/77  Pulse: (!) 102  Weight: 183 lb (83 kg)  Body mass index is 34.58 kg/m.        Physical Examination:   General appearance: Well appearing, and in no distress  Mental status: Alert, oriented to person, place, and time  Skin: Warm & dry  Cardiovascular: Normal heart rate noted  Respiratory: Normal respiratory effort, no distress  Abdomen: Soft, gravid, nontender  Pelvic: Cervical exam deferred         Extremities: Edema: None  Fetal Status: Fetal Heart Rate (bpm): 148 Fundal Height: 30 cm Movement: Present    Chaperone: [place]    No results found for this or any previous visit (from the past 24 hour(s)).  Assessment & Plan:  1) Low-risk pregnancy G1P0000 at [redacted]w[redacted]d with an Estimated Date of Delivery: 09/10/22     Meds: No orders of the defined types were placed in this encounter.  Labs/procedures today:   Plan:  Continue routine obstetrical care  Next visit: prefers in person    Reviewed: Preterm labor  symptoms and general obstetric precautions including but not limited to vaginal bleeding, contractions, leaking of fluid and fetal movement were reviewed in detail with the patient.  All questions were answered. Has home bp cuff.. Check bp weekly, let us know if >140/90.   Follow-up: Return in about 2 weeks (around 07/19/2022) for LROB.  No orders of the defined types were placed in this encounter.  Jacklyn Shell DNP, CNM 07/05/2022 8:55 AM

## 2022-07-19 ENCOUNTER — Ambulatory Visit (INDEPENDENT_AMBULATORY_CARE_PROVIDER_SITE_OTHER): Payer: Medicaid Other | Admitting: Advanced Practice Midwife

## 2022-07-19 VITALS — BP 124/81 | HR 106 | Wt 183.5 lb

## 2022-07-19 DIAGNOSIS — Z3A32 32 weeks gestation of pregnancy: Secondary | ICD-10-CM

## 2022-07-19 NOTE — Patient Instructions (Signed)
Zemira, thank you for choosing our office today! We appreciate the opportunity to meet your healthcare needs. You may receive a short survey by mail, e-mail, or through [employer]. If you are happy with your care we would appreciate if you could take just a few minutes to complete the survey questions. We read all of your comments and take your feedback very seriously. Thank you again for choosing our office.  Center for [employer] Team at [place] at [facility] ([place], [place] [address]) Entrance C, located off of [address] parking   CLASSES: Go to [email or web address] to register for classes (childbirth, breastfeeding, waterbirth, infant CPR, daddy bootcamp, etc.)  Call the office [phone]) or go to [facility] if: You begin to have strong, frequent contractions Your water breaks.  Sometimes it is a big gush of fluid, sometimes it is just a trickle that keeps getting your panties wet or running down your legs You have vaginal bleeding.  It is normal to have a small amount of spotting if your cervix was checked.  You don't feel your baby moving like normal.  If you don't, get you something to eat and drink and lay down and focus on feeling your baby move.   If your baby is still not moving like normal, you should call the office or go to [facility].  Call the office [phone]) or go to [facility] hospital for these signs of pre-eclampsia: Severe headache that does not go away with Tylenol Visual changes- seeing spots, double, blurred vision Pain under your right breast or upper abdomen that does not go away with Tums or heartburn medicine Nausea and/or vomiting Severe swelling in your hands, feet, and face   Tdap Vaccine It is recommended that you get the Tdap vaccine during the third trimester of EACH pregnancy to help protect your baby from getting pertussis (whooping cough) 27-36 weeks is the BEST time to do  this so that you can pass the protection on to your baby. During pregnancy is better than after pregnancy, but if you are unable to get it during pregnancy it will be offered at the hospital.  You can get this vaccine with [place], at the health department, your family doctor, or some local pharmacies Everyone who will be around your baby should also be up-to-date on their vaccines before the baby comes. Adults (who are not pregnant) only need 1 dose of Tdap during adulthood.   [facility] Pediatricians/Family Doctors [facility]): [address] [place], [facility]: [address] [place], [phone]                [facility]): [place], [phone] (call to ask if accepting patients) [facility] Department: [address], [place], [place] Pediatricians/Family Doctors [facility]): [place] [place], [address], [place] [facility]: [address] [place], [place] [facility]: [place], [place]): [phone] [facility]: [address], [place]: [address], [place] [facility]: [phone], [phone]  Home Blood Pressure Monitoring for Patients   Your provider has recommended that you check your  blood pressure (BP) at least once a week at home. If you do not have a blood pressure cuff at home, one will be provided for you. Contact your provider if you have not received your monitor within 1 week.   Helpful Tips for Accurate Home Blood Pressure Checks  Don't smoke, exercise, or drink caffeine 30 minutes before checking your BP Use the restroom before checking your BP (a full bladder can raise your  pressure) Relax in a comfortable upright chair Feet on the ground Left arm resting comfortably on a flat surface at the level of your heart Legs uncrossed Back supported Sit quietly and don't talk Place the cuff on your bare arm Adjust snuggly, so that only two fingertips can fit between your skin and the top of the cuff Check 2 readings separated by at least one minute Keep a log of your BP readings For a visual, please reference this diagram: http://ccnc.care/bpdiagram  Provider Name: [facility]     Phone: [phone]  Zone 1: ALL CLEAR  Continue to monitor your symptoms:  BP reading is less than 140 (top number) or less than 90 (bottom number)  No right upper stomach pain No headaches or seeing spots No feeling nauseated or throwing up No swelling in face and hands  Zone 2: CAUTION Call your doctor's office for any of the following:  BP reading is greater than 140 (top number) or greater than 90 (bottom number)  Stomach pain under your ribs in the middle or right side Headaches or seeing spots Feeling nauseated or throwing up Swelling in face and hands  Zone 3: EMERGENCY  Seek immediate medical care if you have any of the following:  BP reading is greater than160 (top number) or greater than 110 (bottom number) Severe headaches not improving with Tylenol Serious difficulty catching your breath Any worsening symptoms from Zone 2   Third Trimester of Pregnancy The third trimester is from week 29 through week 42, months 7 through 9. The third trimester is a time when the fetus is growing rapidly. At the end of the ninth month, the fetus is about 20 inches in length and weighs 6-10 pounds.  BODY CHANGES Your body goes through many changes during pregnancy. The changes vary from woman to woman.  Your weight will continue to increase. You can expect to gain 25-35 pounds (11-16 kg) by the end of the pregnancy. You may begin to get stretch marks on your hips, abdomen,  and breasts. You may urinate more often because the fetus is moving lower into your pelvis and pressing on your bladder. You may develop or continue to have heartburn as a result of your pregnancy. You may develop constipation because certain hormones are causing the muscles that push waste through your intestines to slow down. You may develop hemorrhoids or swollen, bulging veins (varicose veins). You may have pelvic pain because of the weight gain and pregnancy hormones relaxing your joints between the bones in your pelvis. Backaches may result from overexertion of the muscles supporting your posture. You may have changes in your hair. These can include thickening of your hair, rapid growth, and changes in texture. Some women also have hair loss during or after pregnancy, or hair that feels dry or thin. Your hair will most likely return to normal after your baby is born. Your breasts will continue to grow and be tender. A yellow discharge may leak from your breasts called colostrum. Your belly button may stick out. You may   feel short of breath because of your expanding uterus. You may notice the fetus "dropping," or moving lower in your abdomen. You may have a bloody mucus discharge. This usually occurs a few days to a week before labor begins. Your cervix becomes thin and soft (effaced) near your due date. WHAT TO EXPECT AT YOUR PRENATAL EXAMS  You will have prenatal exams every 2 weeks until week 36. Then, you will have weekly prenatal exams. During a routine prenatal visit: You will be weighed to make sure you and the fetus are growing normally. Your blood pressure is taken. Your abdomen will be measured to track your baby's growth. The fetal heartbeat will be listened to. Any test results from the previous visit will be discussed. You may have a cervical check near your due date to see if you have effaced. At around 36 weeks, your caregiver will check your cervix. At the same time, your  caregiver will also perform a test on the secretions of the vaginal tissue. This test is to determine if a type of bacteria, Group B streptococcus, is present. Your caregiver will explain this further. Your caregiver may ask you: What your birth plan is. How you are feeling. If you are feeling the baby move. If you have had any abnormal symptoms, such as leaking fluid, bleeding, severe headaches, or abdominal cramping. If you have any questions. Other tests or screenings that may be performed during your third trimester include: Blood tests that check for low iron levels (anemia). Fetal testing to check the health, activity level, and growth of the fetus. Testing is done if you have certain medical conditions or if there are problems during the pregnancy. FALSE LABOR You may feel small, irregular contractions that eventually go away. These are called Braxton Hicks contractions, or false labor. Contractions may last for hours, days, or even weeks before true labor sets in. If contractions come at regular intervals, intensify, or become painful, it is best to be seen by your caregiver.  SIGNS OF LABOR  Menstrual-like cramps. Contractions that are 5 minutes apart or less. Contractions that start on the top of the uterus and spread down to the lower abdomen and back. A sense of increased pelvic pressure or back pain. A watery or bloody mucus discharge that comes from the vagina. If you have any of these signs before the 37th week of pregnancy, call your caregiver right away. You need to go to the hospital to get checked immediately. HOME CARE INSTRUCTIONS  Avoid all smoking, herbs, alcohol, and unprescribed drugs. These chemicals affect the formation and growth of the baby. Follow your caregiver's instructions regarding medicine use. There are medicines that are either safe or unsafe to take during pregnancy. Exercise only as directed by your caregiver. Experiencing uterine cramps is a good sign to  stop exercising. Continue to eat regular, healthy meals. Wear a good support bra for breast tenderness. Do not use hot tubs, steam rooms, or saunas. Wear your seat belt at all times when driving. Avoid raw meat, uncooked cheese, cat litter boxes, and soil used by cats. These carry germs that can cause birth defects in the baby. Take your prenatal vitamins. Try taking a stool softener (if your caregiver approves) if you develop constipation. Eat more high-fiber foods, such as fresh vegetables or fruit and whole grains. Drink plenty of fluids to keep your urine clear or pale yellow. Take warm sitz baths to soothe any pain or discomfort caused by hemorrhoids. Use hemorrhoid cream if   your caregiver approves. If you develop varicose veins, wear support hose. Elevate your feet for 15 minutes, 3-4 times a day. Limit salt in your diet. Avoid heavy lifting, wear low heal shoes, and practice good posture. Rest a lot with your legs elevated if you have leg cramps or low back pain. Visit your dentist if you have not gone during your pregnancy. Use a soft toothbrush to brush your teeth and be gentle when you floss. A sexual relationship may be continued unless your caregiver directs you otherwise. Do not travel far distances unless it is absolutely necessary and only with the approval of your caregiver. Take prenatal classes to understand, practice, and ask questions about the labor and delivery. Make a trial run to the hospital. Pack your hospital bag. Prepare the baby's nursery. Continue to go to all your prenatal visits as directed by your caregiver. SEEK MEDICAL CARE IF: You are unsure if you are in labor or if your water has broken. You have dizziness. You have mild pelvic cramps, pelvic pressure, or nagging pain in your abdominal area. You have persistent nausea, vomiting, or diarrhea. You have a bad smelling vaginal discharge. You have pain with urination. SEEK IMMEDIATE MEDICAL CARE IF:  You  have a fever. You are leaking fluid from your vagina. You have spotting or bleeding from your vagina. You have severe abdominal cramping or pain. You have rapid weight loss or gain. You have shortness of breath with chest pain. You notice sudden or extreme swelling of your face, hands, ankles, feet, or legs. You have not felt your baby move in over an hour. You have severe headaches that do not go away with medicine. You have vision changes. Document Released: 10/10/2001 Document Revised: 10/21/2013 Document Reviewed: 12/17/2012 [facility] Patient Information 2015 [place], [place]. This information is not intended to replace advice given to you by your health care provider. Make sure you discuss any questions you have with your health care provider.

## 2022-07-19 NOTE — Progress Notes (Addendum)
   LOW-RISK PREGNANCY VISIT Patient name: Kaitlyn Meyer MRN [id number]  Date of birth: 2001/02/05 Chief Complaint:   Routine Prenatal Visit  History of Present Illness:   Kaitlyn Meyer is a 21 y.o. G47P0000 female at [redacted]w[redacted]d with an Estimated Date of Delivery: 09/10/22 being seen today for ongoing management of a low-risk pregnancy.  Today she reports  may want to start maternity leave 37-38wks; having occ cramping . Contractions: Not present. Vag. Bleeding: None.  Movement: Present. denies leaking of fluid. Review of Systems:   Pertinent items are noted in HPI Denies abnormal vaginal discharge w/ itching/odor/irritation, headaches, visual changes, shortness of breath, chest pain, abdominal pain, severe nausea/vomiting, or problems with urination or bowel movements unless otherwise stated above. Pertinent History Reviewed:  Reviewed past medical,surgical, social, obstetrical and family history.  Reviewed problem list, medications and allergies. Physical Assessment:   Vitals:   07/19/22 1333  BP: 124/81  Pulse: (!) 106  Weight: 183 lb 8 oz (83.2 kg)  Body mass index is 34.67 kg/m.        Physical Examination:   General appearance: Well appearing, and in no distress  Mental status: Alert, oriented to person, place, and time  Skin: Warm & dry  Cardiovascular: Normal heart rate noted  Respiratory: Normal respiratory effort, no distress  Abdomen: Soft, gravid, nontender  Pelvic: Cervical exam deferred         Extremities: Edema: None  Fetal Status: Fetal Heart Rate (bpm): 143 Fundal Height: 32 cm Movement: Present    No results found for this or any previous visit (from the past 24 hour(s)).  Assessment & Plan:  1) Low-risk pregnancy G1P0000 at [redacted]w[redacted]d with an Estimated Date of Delivery: 09/10/22     Meds: No orders of the defined types were placed in this encounter.  Labs/procedures today: none (considering Tdap for next visit)  Plan:  Continue routine obstetrical care    Reviewed: Preterm labor symptoms and general obstetric precautions including but not limited to vaginal bleeding, contractions, leaking of fluid and fetal movement were reviewed in detail with the patient.  All questions were answered. Has home bp cuff.  Check bp weekly, let us know if >140/90.   Follow-up: Return in about 2 weeks (around 08/02/2022) for [place], in person.  No orders of the defined types were placed in this encounter.  Myrtis Ser CNM 07/19/2022 2:02 PM

## 2022-07-29 ENCOUNTER — Encounter (HOSPITAL_COMMUNITY): Payer: Self-pay | Admitting: Obstetrics & Gynecology

## 2022-07-29 ENCOUNTER — Inpatient Hospital Stay (HOSPITAL_BASED_OUTPATIENT_CLINIC_OR_DEPARTMENT_OTHER): Payer: Medicaid Other

## 2022-07-29 ENCOUNTER — Inpatient Hospital Stay (HOSPITAL_COMMUNITY)
Admission: AD | Admit: 2022-07-29 | Discharge: 2022-07-29 | Disposition: A | Discharge status: Home / Self Care | Payer: Medicaid Other | Attending: Obstetrics & Gynecology | Admitting: Obstetrics & Gynecology

## 2022-07-29 DIAGNOSIS — Z3A33 33 weeks gestation of pregnancy: Secondary | ICD-10-CM | POA: Diagnosis not present

## 2022-07-29 DIAGNOSIS — O368130 Decreased fetal movements, third trimester, not applicable or unspecified: Secondary | ICD-10-CM | POA: Diagnosis not present

## 2022-07-29 NOTE — MAU Provider Note (Cosign Needed Addendum)
Chief Complaint:  Decreased Fetal Movement   Event Date/Time   First Provider Initiated Contact with Patient 07/29/22 [phone]      HPI: Kaitlyn Meyer is a 20 y.o. G1P0000 at [redacted]w[redacted]d by early ultrasound who presents to maternity admissions reporting decreased fetal movement since 7 pm last night. She reports the baby usually moves the most at night and she didn't feel much movement last night. She has been up since 1 am and has not felt any movement except for one fluttering movement in the car on the way to MAU. There are no contractions and there is no bleeding. She does have tenderness to touch on her lower abdomen that started a few days ago.    HPI  Past Medical History: Past Medical History:  Diagnosis Date   Medical history non-contributory     Past obstetric history: OB History  Gravida Para Term Preterm AB Living  1 0 0 0 0 0  SAB IAB Ectopic Multiple Live Births  0 0 0 0 0    # Outcome Date GA Lbr Len/2nd Weight Sex Delivery Anes PTL Lv  1 Current             Past Surgical History: Past Surgical History:  Procedure Laterality Date   NO PAST SURGERIES      Family History: Family History  Problem Relation Age of Onset   Healthy Mother    Diabetes Mother    Hypertension Mother    Hyperlipidemia Mother    Healthy Father    Diabetes Father     Social History: Social History   Tobacco Use   Smoking status: Former    Packs/day: 0.25    Types: Cigarettes   Smokeless tobacco: Never  Vaping Use   Vaping Use: Never used  Substance Use Topics   Alcohol use: No   Drug use: No    Allergies: No Known Allergies  Meds:  Medications Prior to Admission  Medication Sig Dispense Refill Last Dose   Prenatal Vit-Fe Fumarate-FA (PRENATAL VITAMIN PO) Take by mouth.   07/28/2022   Blood Pressure Monitor MISC For regular home bp monitoring during pregnancy (Patient not taking: Reported on 07/19/2022) 1 each 0    ondansetron (ZOFRAN-ODT) 4 MG disintegrating tablet Take 1  tablet (4 mg total) by mouth every 6 (six) hours as needed for nausea. (Patient not taking: Reported on 05/05/2022) 20 tablet 0    promethazine (PHENERGAN) 25 MG tablet Take 1 tablet (25 mg total) by mouth every 6 (six) hours as needed for nausea or vomiting. (Patient not taking: Reported on 05/05/2022) 30 tablet 2     ROS:  Review of Systems  Constitutional:  Negative for chills, fatigue and fever.  Respiratory:  Negative for shortness of breath.   Cardiovascular:  Negative for chest pain.  Gastrointestinal:  Positive for abdominal pain. Negative for nausea and vomiting.  Genitourinary:  Negative for difficulty urinating, dysuria, flank pain, pelvic pain, vaginal bleeding, vaginal discharge and vaginal pain.  Neurological:  Negative for dizziness and headaches.  Psychiatric/Behavioral: Negative.       I have reviewed patient's Past Medical Hx, Surgical Hx, Family Hx, Social Hx, medications and allergies.   Physical Exam  Patient Vitals for the past 24 hrs:  BP Temp Temp src Pulse Resp SpO2 Height Weight  07/29/22 0742 -- -- -- -- -- 99 % -- --  07/29/22 0700 -- -- -- -- -- 99 % -- --  07/29/22 0620 125/74 -- -- (!) 105 -- -- -- --  07/29/22 0605 126/73 98.1 F (36.7 C) Oral (!) 107 18 -- 5\' 1"  (1.549 m) 85.4 kg   Constitutional: Well-developed, well-nourished female in no acute distress.  Cardiovascular: normal rate Respiratory: normal effort GI: Abd soft, non-tender, gravid appropriate for gestational age.  MS: Extremities nontender, no edema, normal ROM Neurologic: Alert and oriented x 4.  GU: Neg CVAT.  PELVIC EXAM:      FHT:  Baseline 145 , moderate variability, accelerations present, no decelerations Contractions: rare, mild to palpation   Labs: No results found for this or any previous visit (from the past 24 hour(s)). B/Positive/-- (05/04 1100)  Imaging:  No results found.  MAU Course/MDM: Orders Placed This Encounter  Procedures   [place] MFM FETAL BPP WO NON  STRESS    No orders of the defined types were placed in this encounter.    NST reviewed and reactive After 30 minutes of monitoring, pt felt no fetal movement.  Pt drank cold apple juice and a period of movement lasting 10 minutes occurred followed by another 30+ minutes without any movement BPP ordered  Report to Maye Hides, CNM, with BPP pending     Fatima Blank Certified Nurse-Midwife 07/29/2022 8:14 AM   BPP is 8/8; patient has had reassuring NST with baseline of 130 BPM, mod var, present acel, no decels, and no contractions.   1. Decreased fetal movements in third trimester, single or unspecified fetus   2. [redacted] weeks gestation of pregnancy    -patient reassured by [place] results; she was given strict precautions on fetal monitoring and when to return to MAU  -patient to be discharged home with plan to keep appt this week at [facility] -all questions answered

## 2022-07-29 NOTE — MAU Note (Signed)
Pt says she hasn't felt baby move since 7 pm. Pt has been awake since 0100- trying to get baby to move FHR- 155 in Triage  Has had back pain and cramping- [facility]

## 2022-08-02 ENCOUNTER — Encounter: Payer: Self-pay | Admitting: Advanced Practice Midwife

## 2022-08-02 ENCOUNTER — Ambulatory Visit (INDEPENDENT_AMBULATORY_CARE_PROVIDER_SITE_OTHER): Payer: Medicaid Other | Admitting: Advanced Practice Midwife

## 2022-08-02 VITALS — BP 118/73 | HR 99 | Wt 189.0 lb

## 2022-08-02 DIAGNOSIS — Z3403 Encounter for supervision of normal first pregnancy, third trimester: Secondary | ICD-10-CM

## 2022-08-02 DIAGNOSIS — Z3A34 34 weeks gestation of pregnancy: Secondary | ICD-10-CM

## 2022-08-02 NOTE — Progress Notes (Signed)
   LOW-RISK PREGNANCY VISIT Patient name: Kaitlyn Meyer MRN [id number]  Date of birth: 2001/04/30 Chief Complaint:   Routine Prenatal Visit  History of Present Illness:   Kaitlyn Meyer is a 21 y.o. G38P0000 female at [redacted]w[redacted]d with an Estimated Date of Delivery: 09/10/22 being seen today for ongoing management of a low-risk pregnancy.  Today she reports  having some DFM last week; BLE . Contractions: Not present. Vag. Bleeding: None.  Movement: Present. denies leaking of fluid. Review of Systems:   Pertinent items are noted in HPI Denies abnormal vaginal discharge w/ itching/odor/irritation, headaches, visual changes, shortness of breath, chest pain, abdominal pain, severe nausea/vomiting, or problems with urination or bowel movements unless otherwise stated above. Pertinent History Reviewed:  Reviewed past medical,surgical, social, obstetrical and family history.  Reviewed problem list, medications and allergies. Physical Assessment:   Vitals:   08/02/22 0834  BP: 118/73  Pulse: 99  Weight: 189 lb (85.7 kg)  Body mass index is 35.71 kg/m.        Physical Examination:   General appearance: Well appearing, and in no distress  Mental status: Alert, oriented to person, place, and time  Skin: Warm & dry  Cardiovascular: Normal heart rate noted  Respiratory: Normal respiratory effort, no distress  Abdomen: Soft, gravid, nontender  Pelvic: Cervical exam deferred         Extremities: Edema: Trace/1+  Fetal Status: Fetal Heart Rate (bpm): 134 Fundal Height: 34 cm Movement: Present    No results found for this or any previous visit (from the past 24 hour(s)).  Assessment & Plan:  1) Low-risk pregnancy G1P0000 at [redacted]w[redacted]d with an Estimated Date of Delivery: 09/10/22   2) BLE, tips given to drink sufficient water; elevate feet above heart level when possible   Meds: No orders of the defined types were placed in this encounter.  Labs/procedures today: none  Plan:  Continue  routine obstetrical care    Reviewed: Preterm labor symptoms and general obstetric precautions including but not limited to vaginal bleeding, contractions, leaking of fluid and fetal movement were reviewed in detail with the patient.  All questions were answered. Didn't ask about home bp cuff. Check bp weekly, let us know if >140/90.   Follow-up: Return in about 2 weeks (around 08/16/2022) for [place], in person; GBS/cultures.  No orders of the defined types were placed in this encounter.  Myrtis Ser CNM 08/02/2022 9:01 AM

## 2022-08-16 ENCOUNTER — Ambulatory Visit (INDEPENDENT_AMBULATORY_CARE_PROVIDER_SITE_OTHER): Payer: Medicaid Other | Admitting: Advanced Practice Midwife

## 2022-08-16 ENCOUNTER — Other Ambulatory Visit (HOSPITAL_COMMUNITY)
Admission: RE | Admit: 2022-08-16 | Discharge: 2022-08-16 | Disposition: A | Discharge status: Home / Self Care | Payer: Medicaid Other | Source: Ambulatory Visit | Attending: Advanced Practice Midwife | Admitting: Advanced Practice Midwife

## 2022-08-16 ENCOUNTER — Encounter: Payer: Self-pay | Admitting: Advanced Practice Midwife

## 2022-08-16 VITALS — BP 125/81 | HR 113 | Wt 188.5 lb

## 2022-08-16 DIAGNOSIS — Z3403 Encounter for supervision of normal first pregnancy, third trimester: Secondary | ICD-10-CM

## 2022-08-16 DIAGNOSIS — Z3A36 36 weeks gestation of pregnancy: Secondary | ICD-10-CM

## 2022-08-16 NOTE — Progress Notes (Signed)
   LOW-RISK PREGNANCY VISIT Patient name: Kaitlyn Meyer MRN [id number]  Date of birth: [birth year] Chief Complaint:   Routine Prenatal Visit  History of Present Illness:   Kaitlyn Meyer is a 21 y.o. G20P0000 female at [redacted]w[redacted]d with an Estimated Date of Delivery: 09/10/22 being seen today for ongoing management of a low-risk pregnancy.  Today she reports  some BH ctx . Contractions: Irritability. Vag. Bleeding: None.  Movement: Present. denies leaking of fluid. Review of Systems:   Pertinent items are noted in HPI Denies abnormal vaginal discharge w/ itching/odor/irritation, headaches, visual changes, shortness of breath, chest pain, abdominal pain, severe nausea/vomiting, or problems with urination or bowel movements unless otherwise stated above. Pertinent History Reviewed:  Reviewed past medical,surgical, social, obstetrical and family history.  Reviewed problem list, medications and allergies. Physical Assessment:   Vitals:   08/16/22 1049  BP: 125/81  Pulse: (!) 113  Weight: 188 lb 8 oz (85.5 kg)  Body mass index is 35.62 kg/m.        Physical Examination:   General appearance: Well appearing, and in no distress  Mental status: Alert, oriented to person, place, and time  Skin: Warm & dry  Cardiovascular: Normal heart rate noted  Respiratory: Normal respiratory effort, no distress  Abdomen: Soft, gravid, nontender  Pelvic: Cervical exam deferred         Extremities: Edema: Trace  Fetal Status: Fetal Heart Rate (bpm): 160 Fundal Height: 36 cm Movement: Present Presentation: Vertex  No results found for this or any previous visit (from the past 24 hour(s)).  Assessment & Plan:  1) Low-risk pregnancy G1P0000 at [redacted]w[redacted]d with an Estimated Date of Delivery: 09/10/22     Meds: No orders of the defined types were placed in this encounter.  Labs/procedures today: GBS & cultures; Tdap next week as RN-only  Plan:  Continue routine obstetrical care   Reviewed: Term labor  symptoms and general obstetric precautions including but not limited to vaginal bleeding, contractions, leaking of fluid and fetal movement were reviewed in detail with the patient.  All questions were answered. Didn't ask about home bp cuff. Check bp weekly, let us know if >140/90.   Follow-up: Return for As scheduled; wants RN only for Tdap on Tuesday.  Orders Placed This Encounter  Procedures   Culture, beta strep (group b only)   Myrtis Ser [facility] 08/16/2022 11:17 AM

## 2022-08-17 LAB — CERVICOVAGINAL ANCILLARY ONLY
Chlamydia: NEGATIVE
Comment: NEGATIVE
Comment: NORMAL
Neisseria Gonorrhea: NEGATIVE

## 2022-08-20 LAB — CULTURE, BETA STREP (GROUP B ONLY): Strep Gp B Culture: NEGATIVE

## 2022-08-22 ENCOUNTER — Ambulatory Visit (INDEPENDENT_AMBULATORY_CARE_PROVIDER_SITE_OTHER): Payer: Medicaid Other | Admitting: *Deleted

## 2022-08-22 DIAGNOSIS — Z3A37 37 weeks gestation of pregnancy: Secondary | ICD-10-CM

## 2022-08-22 DIAGNOSIS — Z23 Encounter for immunization: Secondary | ICD-10-CM

## 2022-08-22 NOTE — Progress Notes (Signed)
   NURSE VISIT- INJECTION  SUBJECTIVE:  Kaitlyn Meyer is a 21 y.o. G97P0000 female here for a Tdap for protection from tetanus, diptheria, and pertussis. She is [redacted]w[redacted]d pregnant.   OBJECTIVE:  BP 116/68   Pulse (!) 106   LMP 11/27/2021 (Exact Date)   Appears well, in no apparent distress  Injection administered in: Left deltoid  No orders of the defined types were placed in this encounter.   ASSESSMENT: Pregnancy [redacted]w[redacted]d Tdap for protection from tetanus, diptheria, and pertussis PLAN: Follow-up: as scheduled   Janece Canterbury  08/22/2022 10:32 AM

## 2022-08-23 ENCOUNTER — Ambulatory Visit (INDEPENDENT_AMBULATORY_CARE_PROVIDER_SITE_OTHER): Payer: Medicaid Other | Admitting: Advanced Practice Midwife

## 2022-08-23 VITALS — BP 133/83 | HR 99 | Wt 190.0 lb

## 2022-08-23 DIAGNOSIS — Z3A37 37 weeks gestation of pregnancy: Secondary | ICD-10-CM

## 2022-08-23 DIAGNOSIS — Z3403 Encounter for supervision of normal first pregnancy, third trimester: Secondary | ICD-10-CM

## 2022-08-23 NOTE — Progress Notes (Signed)
   LOW-RISK PREGNANCY VISIT Patient name: Kaitlyn Meyer MRN [id number]  Date of birth: 01-29-2001 Chief Complaint:   Routine Prenatal Visit  History of Present Illness:   Kaitlyn Meyer is a 21 y.o. G8P0000 female at [redacted]w[redacted]d with an Estimated Date of Delivery: 09/10/22 being seen today for ongoing management of a low-risk pregnancy.  Today she reports  not feeling FM overnight . Contractions: Irritability. Vag. Bleeding: None.  Movement: (!) Decreased. denies leaking of fluid. Review of Systems:   Pertinent items are noted in HPI Denies abnormal vaginal discharge w/ itching/odor/irritation, headaches, visual changes, shortness of breath, chest pain, abdominal pain, severe nausea/vomiting, or problems with urination or bowel movements unless otherwise stated above. Pertinent History Reviewed:  Reviewed past medical,surgical, social, obstetrical and family history.  Reviewed problem list, medications and allergies. Physical Assessment:   Vitals:   08/23/22 1028  BP: 133/83  Pulse: 99  Weight: 190 lb (86.2 kg)  Body mass index is 35.9 kg/m.        Physical Examination:   General appearance: Well appearing, and in no distress  Mental status: Alert, oriented to person, place, and time  Skin: Warm & dry  Cardiovascular: Normal heart rate noted  Respiratory: Normal respiratory effort, no distress  Abdomen: Soft, gravid, nontender  Pelvic: Cervical exam performed  Dilation: Closed Effacement (%): Thick Station: -3  Extremities: Edema: Trace  Fetal Status: Fetal Heart Rate (bpm): 150s NST   Movement: (!) Decreased    NST: baseline 150s, +accels, no decels, some UI, reactive  No results found for this or any previous visit (from the past 24 hour(s)).  Assessment & Plan:  1) Low-risk pregnancy G1P0000 at [redacted]w[redacted]d with an Estimated Date of Delivery: 09/10/22   2) DFM, didn't feel FM overnight, but has felt adequate FM according to standards   Meds: No orders of the defined types  were placed in this encounter.  Labs/procedures today: NST  Plan:  Continue routine obstetrical care   Reviewed: Term labor symptoms and general obstetric precautions including but not limited to vaginal bleeding, contractions, leaking of fluid and fetal movement were reviewed in detail with the patient.  All questions were answered. Has home bp cuff. Check bp weekly, let us know if >140/90.   Follow-up: Return for As scheduled.  No orders of the defined types were placed in this encounter.  Myrtis Ser CNM 08/23/2022 10:55 AM

## 2022-08-23 NOTE — Patient Instructions (Signed)
Try the positions on the [email or web address] site-

## 2022-08-31 ENCOUNTER — Ambulatory Visit (INDEPENDENT_AMBULATORY_CARE_PROVIDER_SITE_OTHER): Payer: Medicaid Other | Admitting: Advanced Practice Midwife

## 2022-08-31 VITALS — BP 138/84 | HR 126 | Wt 193.0 lb

## 2022-08-31 DIAGNOSIS — Z3A38 38 weeks gestation of pregnancy: Secondary | ICD-10-CM

## 2022-08-31 DIAGNOSIS — Z3403 Encounter for supervision of normal first pregnancy, third trimester: Secondary | ICD-10-CM

## 2022-08-31 NOTE — Progress Notes (Deleted)
   LOW-RISK PREGNANCY VISIT Patient name: Kaitlyn Meyer MRN [id number]  Date of birth: 06-02-2001 Chief Complaint:   Routine Prenatal Visit  History of Present Illness:   Kaitlyn Meyer is a 21 y.o. G25P0000 female at [redacted]w[redacted]d with an Estimated Date of Delivery: 09/10/22 being seen today for ongoing management of a low-risk pregnancy.  Today she reports {sx:14538}. Contractions: Irritability. Vag. Bleeding: None.  Movement: Present. {Actions; denies-reports:120008} leaking of fluid. Review of Systems:   Pertinent items are noted in HPI Denies abnormal vaginal discharge w/ itching/odor/irritation, headaches, visual changes, shortness of breath, chest pain, abdominal pain, severe nausea/vomiting, or problems with urination or bowel movements unless otherwise stated above. Pertinent History Reviewed:  Reviewed past medical,surgical, social, obstetrical and family history.  Reviewed problem list, medications and allergies. Physical Assessment:   Vitals:   08/31/22 0836  BP: 138/84  Pulse: (!) 126  Weight: 193 lb (87.5 kg)  Body mass index is 36.47 kg/m.        Physical Examination:   General appearance: Well appearing, and in no distress  Mental status: Alert, oriented to person, place, and time  Skin: Warm & dry  Cardiovascular: Normal heart rate noted  Respiratory: Normal respiratory effort, no distress  Abdomen: Soft, gravid, nontender  Pelvic: {Blank single:19197::"Cervical exam performed","Cervical exam deferred"}         Extremities: Edema: None  Fetal Status:     Movement: Present    Chaperone: {Blank single:19197::"n/a","Amanda Rash","Janet Young","Latisha Cresenzo","Peggy Dones","Sabrina Holland"}    No results found for this or any previous visit (from the past 24 hour(s)).  Assessment & Plan:  1) Low-risk pregnancy G1P0000 at [redacted]w[redacted]d with an Estimated Date of Delivery: 09/10/22   2) ***, ***   Meds: No orders of the defined types were placed in this  encounter.  Labs/procedures today: ***  Plan:  Continue routine obstetrical care *** Next visit: prefers {Blank single:19197::"in person","online","will be in person for ***"}    Reviewed: {Blank single:19197::"Term","Preterm"} labor symptoms and general obstetric precautions including but not limited to vaginal bleeding, contractions, leaking of fluid and fetal movement were reviewed in detail with the patient.  All questions were answered. *** home bp cuff. Rx faxed to ***. Check bp weekly, let us know if >140/90.   Follow-up: No follow-ups on file.  Future Appointments  Date Time Provider [place]  09/06/2022  8:30 AM Myrtis Ser, CNM [facility]    No orders of the defined types were placed in this encounter.  Christin Fudge DNP, CNM 08/31/2022 8:52 AM

## 2022-08-31 NOTE — Patient Instructions (Signed)

## 2022-08-31 NOTE — Progress Notes (Signed)
   LOW-RISK PREGNANCY VISIT Patient name: Kaitlyn Meyer MRN [id number]  Date of birth: August 01, 2001 Chief Complaint:   Routine Prenatal Visit  History of Present Illness:   Kaitlyn Meyer is a 21 y.o. G77P0000 female at [redacted]w[redacted]d with an Estimated Date of Delivery: 09/10/22 being seen today for ongoing management of a low-risk pregnancy.  Today she reports no complaints. Contractions: Irritability. Vag. Bleeding: None.  Movement: Present. denies leaking of fluid. BPs at home are all <140/90.  Review of Systems:   Pertinent items are noted in HPI Denies abnormal vaginal discharge w/ itching/odor/irritation, headaches, visual changes, shortness of breath, chest pain, abdominal pain, severe nausea/vomiting, or problems with urination or bowel movements unless otherwise stated above. Pertinent History Reviewed:  Reviewed past medical,surgical, social, obstetrical and family history.  Reviewed problem list, medications and allergies. Physical Assessment:   Vitals:   08/31/22 0836  BP: 138/84  Pulse: (!) 126  Weight: 193 lb (87.5 kg)  Body mass index is 36.47 kg/m.        Physical Examination:   General appearance: Well appearing, and in no distress  Mental status: Alert, oriented to person, place, and time  Skin: Warm & dry  Cardiovascular: Normal heart rate noted  Respiratory: Normal respiratory effort, no distress  Abdomen: Soft, gravid, nontender  Pelvic: Cervical exam performed  Dilation: Closed Effacement (%): Thick Station: -3  Extremities: Edema: None  Fetal Status: Fetal Heart Rate (bpm): 140 Fundal Height: 38 cm Movement: Present Presentation: Vertex  Chaperone:  calandra, RN     No results found for this or any previous visit (from the past 24 hour(s)).  Assessment & Plan:  1) Low-risk pregnancy G1P0000 at [redacted]w[redacted]d with an Estimated Date of Delivery: 09/10/22     Meds: No orders of the defined types were placed in this encounter.  Labs/procedures today: none  Plan:   Continue routine obstetrical care  Next visit: prefers in person    Reviewed: Term labor symptoms and general obstetric precautions including but not limited to vaginal bleeding, contractions, leaking of fluid and fetal movement were reviewed in detail with the patient.  All questions were answered. Has home bp cuff. Check bp weekly, let us know if >140/90.   Follow-up: No follow-ups on file.  Future Appointments  Date Time Provider [place]  09/06/2022  8:30 AM Myrtis Ser, CNM [facility]    No orders of the defined types were placed in this encounter.  Christin Fudge DNP, CNM 08/31/2022 9:03 AM

## 2022-09-06 ENCOUNTER — Ambulatory Visit (INDEPENDENT_AMBULATORY_CARE_PROVIDER_SITE_OTHER): Payer: Medicaid Other | Admitting: Advanced Practice Midwife

## 2022-09-06 VITALS — BP 133/79 | HR 91 | Wt 195.0 lb

## 2022-09-06 DIAGNOSIS — Z3403 Encounter for supervision of normal first pregnancy, third trimester: Secondary | ICD-10-CM

## 2022-09-06 DIAGNOSIS — Z3A39 39 weeks gestation of pregnancy: Secondary | ICD-10-CM

## 2022-09-06 NOTE — Progress Notes (Signed)
   LOW-RISK PREGNANCY VISIT Patient name: Kaitlyn Meyer MRN [id number]  Date of birth: 2001/08/02 Chief Complaint:   Routine Prenatal Visit  History of Present Illness:   Kaitlyn Meyer is a 22 y.o. G4P0000 female at [redacted]w[redacted]d with an Estimated Date of Delivery: 09/10/22 being seen today for ongoing management of a low-risk pregnancy.  Today she reports no complaints. Contractions: Not present. Vag. Bleeding: None.  Movement: Present. denies leaking of fluid. Review of Systems:   Pertinent items are noted in HPI Denies abnormal vaginal discharge w/ itching/odor/irritation, headaches, visual changes, shortness of breath, chest pain, abdominal pain, severe nausea/vomiting, or problems with urination or bowel movements unless otherwise stated above. Pertinent History Reviewed:  Reviewed past medical,surgical, social, obstetrical and family history.  Reviewed problem list, medications and allergies. Physical Assessment:   Vitals:   09/06/22 0831  BP: 133/79  Pulse: 91  Weight: 195 lb (88.5 kg)  Body mass index is 36.84 kg/m.        Physical Examination:   General appearance: Well appearing, and in no distress  Mental status: Alert, oriented to person, place, and time  Skin: Warm & dry  Cardiovascular: Normal heart rate noted  Respiratory: Normal respiratory effort, no distress  Abdomen: Soft, gravid, nontender  Pelvic: Cervical exam performed  Dilation: Closed Effacement (%): Thick Station: -3  Extremities: Edema: Trace  Fetal Status: Fetal Heart Rate (bpm): 138 Fundal Height: 38 cm Movement: Present Presentation: Vertex  No results found for this or any previous visit (from the past 24 hour(s)).  Assessment & Plan:  1) Low-risk pregnancy G1P0000 at [redacted]w[redacted]d with an Estimated Date of Delivery: 09/10/22   2) Discussed PD protocol, prefers SOL; will get NST w next visit and then sched IOL ~41 wks   Meds: No orders of the defined types were placed in this  encounter.  Labs/procedures today: SVE  Plan:  Continue routine obstetrical care   Reviewed: Term labor symptoms and general obstetric precautions including but not limited to vaginal bleeding, contractions, leaking of fluid and fetal movement were reviewed in detail with the patient.  All questions were answered. Has home bp cuff. Check bp weekly, let us know if >140/90.   Follow-up: Return for LROB 11/13 or 11/14 with postdates NST.  No orders of the defined types were placed in this encounter.  Arabella Merles CNM 09/06/2022 9:01 AM

## 2022-09-06 NOTE — Patient Instructions (Signed)
Try the positions on [email or web address]!

## 2022-09-11 ENCOUNTER — Other Ambulatory Visit: Payer: Medicaid Other

## 2022-09-11 ENCOUNTER — Encounter: Payer: Self-pay | Admitting: Advanced Practice Midwife

## 2022-09-11 ENCOUNTER — Ambulatory Visit (INDEPENDENT_AMBULATORY_CARE_PROVIDER_SITE_OTHER): Payer: Medicaid Other | Admitting: Advanced Practice Midwife

## 2022-09-11 DIAGNOSIS — Z3403 Encounter for supervision of normal first pregnancy, third trimester: Secondary | ICD-10-CM

## 2022-09-11 DIAGNOSIS — O480 Post-term pregnancy: Secondary | ICD-10-CM

## 2022-09-11 DIAGNOSIS — Z3A40 40 weeks gestation of pregnancy: Secondary | ICD-10-CM

## 2022-09-11 NOTE — Progress Notes (Signed)
   LOW-RISK PREGNANCY VISIT Patient name: Kaitlyn Meyer MRN [id number]  Date of birth: 20-Jul-2001 Chief Complaint:   Routine Prenatal Visit and Non-stress Test  History of Present Illness:   Kaitlyn Meyer is a 21 y.o. G41P0000 female at [redacted]w[redacted]d with an Estimated Date of Delivery: 09/10/22 being seen today for ongoing management of a low-risk pregnancy.  Today she reports no complaints. Contractions: Irritability.  .  Movement: Present. denies leaking of fluid. Review of Systems:   Pertinent items are noted in HPI Denies abnormal vaginal discharge w/ itching/odor/irritation, headaches, visual changes, shortness of breath, chest pain, abdominal pain, severe nausea/vomiting, or problems with urination or bowel movements unless otherwise stated above. Pertinent History Reviewed:  Reviewed past medical,surgical, social, obstetrical and family history.  Reviewed problem list, medications and allergies. Physical Assessment:   Vitals:   09/11/22 1058  BP: 119/76  Pulse: 81  Weight: 198 lb (89.8 kg)  Body mass index is 37.41 kg/m.        Physical Examination:   General appearance: Well appearing, and in no distress  Mental status: Alert, oriented to person, place, and time  Skin: Warm & dry  Cardiovascular: Normal heart rate noted  Respiratory: Normal respiratory effort, no distress  Abdomen: Soft, gravid, nontender  Pelvic: Cervical exam deferred         Extremities: Edema: Trace  Fetal Status:     Movement: Present  NST: FHR baseline 145 bpm, Variability: moderate, Accelerations:present, Decelerations:  Absent= Cat 1/Reactive   Chaperone: n/a    No results found for this or any previous visit (from the past 24 hour(s)).  Assessment & Plan:  1) Low-risk pregnancy G1P0000 at [redacted]w[redacted]d with an Estimated Date of Delivery: 09/10/22   2) postdates, IOL requested b/t 11/18 & 11/21   Meds: No orders of the defined types were placed in this encounter.  Labs/procedures today:  NST  Plan:  Continue routine obstetrical care  Next visit: prefers in person    Reviewed: Term labor symptoms and general obstetric precautions including but not limited to vaginal bleeding, contractions, leaking of fluid and fetal movement were reviewed in detail with the patient.  All questions were answered. Has home bp cuff.. Check bp weekly, let us know if >140/90.   Follow-up: Return for thursday for RN NST.  No future appointments.  No orders of the defined types were placed in this encounter.  Jacklyn Shell DNP, CNM 09/11/2022 11:28 AM

## 2022-09-11 NOTE — Patient Instructions (Signed)

## 2022-09-12 ENCOUNTER — Telehealth (HOSPITAL_COMMUNITY): Payer: Self-pay | Admitting: *Deleted

## 2022-09-12 ENCOUNTER — Encounter: Payer: Self-pay | Admitting: Advanced Practice Midwife

## 2022-09-12 ENCOUNTER — Other Ambulatory Visit: Payer: Self-pay | Admitting: Advanced Practice Midwife

## 2022-09-12 ENCOUNTER — Encounter (HOSPITAL_COMMUNITY): Payer: Self-pay | Admitting: *Deleted

## 2022-09-12 NOTE — Telephone Encounter (Signed)
Preadmission screen  

## 2022-09-13 ENCOUNTER — Other Ambulatory Visit: Payer: Self-pay

## 2022-09-13 ENCOUNTER — Encounter (HOSPITAL_COMMUNITY): Payer: Self-pay | Admitting: Obstetrics and Gynecology

## 2022-09-13 ENCOUNTER — Inpatient Hospital Stay (HOSPITAL_COMMUNITY)
Admission: AD | Admit: 2022-09-13 | Discharge: 2022-09-17 | DRG: 805 | Disposition: A | Discharge status: Home / Self Care | Payer: Medicaid Other | Attending: Obstetrics and Gynecology | Admitting: Obstetrics and Gynecology

## 2022-09-13 DIAGNOSIS — O411231 Chorioamnionitis, third trimester, fetus 1: Secondary | ICD-10-CM | POA: Diagnosis not present

## 2022-09-13 DIAGNOSIS — O368130 Decreased fetal movements, third trimester, not applicable or unspecified: Secondary | ICD-10-CM | POA: Diagnosis present

## 2022-09-13 DIAGNOSIS — O09899 Supervision of other high risk pregnancies, unspecified trimester: Secondary | ICD-10-CM | POA: Diagnosis not present

## 2022-09-13 DIAGNOSIS — Z3A40 40 weeks gestation of pregnancy: Secondary | ICD-10-CM

## 2022-09-13 DIAGNOSIS — O9902 Anemia complicating childbirth: Secondary | ICD-10-CM | POA: Diagnosis present

## 2022-09-13 DIAGNOSIS — Z3403 Encounter for supervision of normal first pregnancy, third trimester: Secondary | ICD-10-CM

## 2022-09-13 DIAGNOSIS — O411230 Chorioamnionitis, third trimester, not applicable or unspecified: Secondary | ICD-10-CM | POA: Diagnosis present

## 2022-09-13 DIAGNOSIS — Z2839 Other underimmunization status: Secondary | ICD-10-CM

## 2022-09-13 DIAGNOSIS — O480 Post-term pregnancy: Secondary | ICD-10-CM | POA: Diagnosis present

## 2022-09-13 DIAGNOSIS — Z87891 Personal history of nicotine dependence: Secondary | ICD-10-CM | POA: Diagnosis not present

## 2022-09-13 DIAGNOSIS — O368190 Decreased fetal movements, unspecified trimester, not applicable or unspecified: Secondary | ICD-10-CM | POA: Diagnosis present

## 2022-09-13 DIAGNOSIS — Z3400 Encounter for supervision of normal first pregnancy, unspecified trimester: Secondary | ICD-10-CM

## 2022-09-13 HISTORY — DX: Female pelvic inflammatory disease, unspecified: N73.9

## 2022-09-13 HISTORY — DX: Urinary tract infection, site not specified: N39.0

## 2022-09-13 HISTORY — DX: Depression, unspecified: F32.A

## 2022-09-13 HISTORY — DX: Anxiety disorder, unspecified: F41.9

## 2022-09-13 LAB — TYPE AND SCREEN
ABO/RH(D): B POS
Antibody Screen: NEGATIVE

## 2022-09-13 LAB — CBC
HCT: 41.4 % (ref 36.0–46.0)
Hemoglobin: 13 g/dL (ref 12.0–15.0)
MCH: 29.1 pg (ref 26.0–34.0)
MCHC: 31.4 g/dL (ref 30.0–36.0)
MCV: 92.8 fL (ref 80.0–100.0)
Platelets: 185 10*3/uL (ref 150–400)
RBC: 4.46 MIL/uL (ref 3.87–5.11)
RDW: 16.3 % — ABNORMAL HIGH (ref 11.5–15.5)
WBC: 8.6 10*3/uL (ref 4.0–10.5)
nRBC: 0 % (ref 0.0–0.2)

## 2022-09-13 LAB — AMNISURE RUPTURE OF MEMBRANE (ROM) NOT AT ARMC: Amnisure ROM: NEGATIVE

## 2022-09-13 MED ORDER — MISOPROSTOL 50MCG HALF TABLET
50.0000 ug | ORAL_TABLET | Freq: Once | ORAL | Status: AC
  Administered 2022-09-13: 50 ug via BUCCAL
  Filled 2022-09-13: qty 1

## 2022-09-13 MED ORDER — ACETAMINOPHEN 325 MG PO TABS: 650.0000 mg | ORAL_TABLET | ORAL | Status: DC | PRN

## 2022-09-13 MED ORDER — OXYCODONE-ACETAMINOPHEN 5-325 MG PO TABS: 2.0000 | ORAL_TABLET | ORAL | Status: DC | PRN

## 2022-09-13 MED ORDER — LACTATED RINGERS IV SOLN: 500.0000 mL | INTRAVENOUS | Status: DC | PRN

## 2022-09-13 MED ORDER — LIDOCAINE HCL (PF) 1 % IJ SOLN
30.0000 mL | INTRAMUSCULAR | Status: AC | PRN
  Administered 2022-09-15: 30 mL via SUBCUTANEOUS
  Filled 2022-09-13: qty 30

## 2022-09-13 MED ORDER — MISOPROSTOL 25 MCG QUARTER TABLET
25.0000 ug | ORAL_TABLET | Freq: Once | ORAL | Status: AC
  Administered 2022-09-13: 25 ug via VAGINAL
  Filled 2022-09-13: qty 1

## 2022-09-13 MED ORDER — HYDROXYZINE HCL 50 MG PO TABS: 50.0000 mg | ORAL_TABLET | Freq: Four times a day (QID) | ORAL | Status: DC | PRN

## 2022-09-13 MED ORDER — TERBUTALINE SULFATE 1 MG/ML IJ SOLN: 0.2500 mg | Freq: Once | INTRAMUSCULAR | Status: DC | PRN

## 2022-09-13 MED ORDER — ONDANSETRON HCL 4 MG/2ML IJ SOLN
4.0000 mg | Freq: Four times a day (QID) | INTRAMUSCULAR | Status: DC | PRN
  Administered 2022-09-14 (×2): 4 mg via INTRAVENOUS
  Filled 2022-09-13 (×2): qty 2

## 2022-09-13 MED ORDER — FLEET ENEMA 7-19 GM/118ML RE ENEM: 1.0000 | ENEMA | RECTAL | Status: DC | PRN

## 2022-09-13 MED ORDER — OXYTOCIN BOLUS FROM INFUSION
333.0000 mL | Freq: Once | INTRAVENOUS | Status: AC
  Administered 2022-09-15: 333 mL via INTRAVENOUS

## 2022-09-13 MED ORDER — FENTANYL CITRATE (PF) 100 MCG/2ML IJ SOLN
50.0000 ug | INTRAMUSCULAR | Status: DC | PRN
  Administered 2022-09-14 (×4): 100 ug via INTRAVENOUS
  Administered 2022-09-14 – 2022-09-15 (×2): 50 ug via INTRAVENOUS
  Filled 2022-09-13 (×6): qty 2

## 2022-09-13 MED ORDER — OXYCODONE-ACETAMINOPHEN 5-325 MG PO TABS: 1.0000 | ORAL_TABLET | ORAL | Status: DC | PRN

## 2022-09-13 MED ORDER — OXYTOCIN-SODIUM CHLORIDE 30-0.9 UT/500ML-% IV SOLN
1.0000 m[IU]/min | INTRAVENOUS | Status: DC
  Administered 2022-09-14: 2 m[IU]/min via INTRAVENOUS
  Filled 2022-09-13: qty 500

## 2022-09-13 MED ORDER — OXYTOCIN-SODIUM CHLORIDE 30-0.9 UT/500ML-% IV SOLN
2.5000 [IU]/h | INTRAVENOUS | Status: DC
  Filled 2022-09-13: qty 500

## 2022-09-13 MED ORDER — MISOPROSTOL 25 MCG QUARTER TABLET
25.0000 ug | ORAL_TABLET | Freq: Once | ORAL | Status: AC
  Administered 2022-09-13: 25 ug via ORAL
  Filled 2022-09-13: qty 1

## 2022-09-13 MED ORDER — LACTATED RINGERS IV SOLN: INTRAVENOUS | Status: DC

## 2022-09-13 MED ORDER — SOD CITRATE-CITRIC ACID 500-334 MG/5ML PO SOLN: 30.0000 mL | ORAL | Status: DC | PRN

## 2022-09-13 NOTE — MAU Provider Note (Signed)
History     CSN: [id number]  Arrival date and time: 09/13/22 1343   Event Date/Time   First Provider Initiated Contact with Patient 09/13/22 1411      Chief Complaint  Patient presents with   Decreased Fetal Movement   Rupture of Membranes   Pt presents with decreased fetal movement since 3-4 am this morning. She called the on-call nurse and tried all of her suggestions of eating, waiting a couple of hours and she noted minimal movement for what is typical for her baby.  Her cervix was checked in the office and was closed. She has felt some fluid draining from her vagina. Feels cramping, but unsure if these are contractions.     OB History     Gravida  1   Para  0   Term  0   Preterm  0   AB  0   Living  0      SAB  0   IAB  0   Ectopic  0   Multiple  0   Live Births  0           Past Medical History:  Diagnosis Date   Anxiety    Depression    currently doing fine (11/23)   PID (pelvic inflammatory disease)    UTI (urinary tract infection)     Past Surgical History:  Procedure Laterality Date   NO PAST SURGERIES      Family History  Problem Relation Age of Onset   Cancer Mother        breast   Heart disease Mother        "partial heart failure"   Diabetes Mother    Hypertension Mother    Hyperlipidemia Mother    Drug abuse Father    Alcohol abuse Father    Diabetes Father    Bipolar disorder Father     Social History   Tobacco Use   Smoking status: Former    Packs/day: 0.25    Types: Cigarettes   Smokeless tobacco: Never  Vaping Use   Vaping Use: Never used  Substance Use Topics   Alcohol use: No   Drug use: No    Allergies: No Known Allergies  Medications Prior to Admission  Medication Sig Dispense Refill Last Dose   Prenatal Vit-Fe Fumarate-FA (PRENATAL VITAMIN PO) Take by mouth.   09/12/2022   Blood Pressure Monitor MISC For regular home bp monitoring during pregnancy 1 each 0     Review of Systems   Constitutional:  Negative for appetite change, chills and fever.  HENT:  Negative for congestion, facial swelling and postnasal drip.   Eyes:  Negative for photophobia.  Respiratory:  Negative for cough and shortness of breath.   Cardiovascular:  Negative for chest pain.  Gastrointestinal:  Positive for abdominal pain. Negative for nausea and vomiting.  Endocrine: Negative for polyuria.  Genitourinary:  Negative for flank pain and pelvic pain.  Musculoskeletal:  Negative for arthralgias.  Skin:  Negative for rash.  Neurological:  Negative for weakness and light-headedness.  Psychiatric/Behavioral:  Negative for confusion.    Physical Exam   Blood pressure 135/71, pulse (!) 106, temperature 98.4 F (36.9 C), temperature source Oral, resp. rate 18, height 5\' 1"  (1.549 m), weight 89.9 kg, last menstrual period 11/27/2021, SpO2 98 %.  Physical Exam Vitals reviewed.  Constitutional:      Appearance: Normal appearance.  HENT:     Head: Normocephalic and atraumatic.  Right Ear: External ear normal.     Left Ear: External ear normal.     Nose: Nose normal.     Mouth/Throat:     Pharynx: Oropharynx is clear.  Eyes:     Conjunctiva/sclera: Conjunctivae normal.  Cardiovascular:     Rate and Rhythm: Normal rate and regular rhythm.  Pulmonary:     Effort: Pulmonary effort is normal.  Abdominal:     Palpations: Abdomen is soft.     Comments: Gravid  Genitourinary:    General: Normal vulva.     Vagina: No vaginal discharge.     Rectum: Normal.     Comments: Dilation: Closed Effacement (%): Thick Cervical Position: Posterior Exam by:: Meyer  Musculoskeletal:        General: Normal range of motion.  Skin:    General: Skin is warm.     Capillary Refill: Capillary refill takes less than 2 seconds.  Neurological:     Mental Status: Mental status is at baseline.  Psychiatric:        Mood and Affect: Mood normal.     MAU Course   MDM Severe: Pt has DFM with term  gestation. SVE unchanged from clinic, Late decels noted on NST. Will admit to L&D.   Assessment and Plan  Decreased fetal movement Cramping Late decelerations- improved with positioning SVE closed.  Amnisure pending  Given term gestation, DFM and late decels on NST, pt will be admitted to L&D.   Kaitlyn Meyer 09/13/2022, 2:19 PM

## 2022-09-13 NOTE — MAU Note (Signed)
Kaitlyn Meyer is a 21 y.o. at [redacted]w[redacted]d here in MAU reporting: was awakened at 0300, has been up since then, has not been feeling the baby move.   Reports mild cramping off and on. Yesterday morning had some watery d/c, none since, no bleeding  Onset of complaint: 0300 Pain score: 3 There were no vitals filed for this visit.   WKG:SUPJS directly to rm Lab orders placed from triage:  fern

## 2022-09-13 NOTE — H&P (Signed)
OBSTETRIC ADMISSION HISTORY AND PHYSICAL  Trinka SANOE HAZAN is a 21 y.o. female G1P0000 with IUP at [redacted]w[redacted]d by LMP presenting for DFM. She reports +FM now, No LOF, no VB, no blurry vision, headaches or peripheral edema, and RUQ pain.  She plans on breast feeding. She knows options, but currently declines birth control and wants to talk about it at postpartum visit. She received her prenatal care at [facility]   Dating: By LMP --->  Estimated Date of Delivery: 09/10/22  Sono:    @[redacted]w[redacted]d , CWD, normal anatomy, breech presentation, 408g, 21% EFW   Prenatal History/Complications:  Patient Active Problem List   Diagnosis Date Noted   Indication for care in labor or delivery 09/13/2022   Decreased fetal movement 09/13/2022   Rubella non-immune status, antepartum 05/05/2022   Supervision of normal first pregnancy 03/02/2022   Vitamin D deficiency 06/28/2021   Family history of breast cancer 06/27/2021     FAMILY TREE  RESULTS  Language English Pap 06/27/21 neg  Initiated care at 12wks GC/CT Initial:  -/-          36wks:-/-  Dating by 6wk 06/29/21    Support person  Genetics NT/IT: neg   Panorama: low risk female  BP cuff rx'd Carrier Screen neg    [place]/Hgb Elec   Rhogam n/a    TDaP vaccine 08/22/22 Blood Type B/Positive/-- (05/04 1100)  Flu vaccine declined Antibody Negative (08/07 0803)  Covid vaccine  HBsAg Negative (05/04 1100)    RPR Non Reactive (08/07 0803)  Anatomy 01-15-1987 Nl girl 'Sage' Rubella  <0.90 (05/04 1100)  Feeding Plan breast HIV Non Reactive (08/07 0803)  Contraception declines Hep C neg  Circumcision n/a    Pediatrician [facility]  A1C/GTT Early:      26-28wks: normal  Prenatal Classes discussed      GBS Negative/-- (10/18 1503) neg  [ ]  PCN allergy  BTL Consent     VBAC Consent n/a PHQ9 & GAD7  [ ] New OB  [ ] 28wks   [ ] 36wks  Waterbirth [ ] Class [ ]  36wkCNM visit/consent       Past Medical History: Past Medical History:  Diagnosis Date   Anxiety    Depression     currently doing fine (11/23)   PID (pelvic inflammatory disease)    UTI (urinary tract infection)     Past Surgical History: Past Surgical History:  Procedure Laterality Date   NO PAST SURGERIES      Obstetrical History: OB History     Gravida  1   Para  0   Term  0   Preterm  0   AB  0   Living  0      SAB  0   IAB  0   Ectopic  0   Multiple  0   Live Births  0           Social History Social History   Socioeconomic History   Marital status: Single    Spouse name: Not on file   Number of children: Not on file   Years of education: Not on file   Highest education level: Not on file  Occupational History   Not on file  Tobacco Use   Smoking status: Former    Packs/day: 0.25    Types: Cigarettes   Smokeless tobacco: Never  Vaping Use   Vaping Use: Never used  Substance and Sexual Activity   Alcohol use: No   Drug use:  No   Sexual activity: Not Currently    Birth control/protection: None  Other Topics Concern   Not on file  Social History Narrative   Not on file   Social Determinants of Health   Financial Resource Strain: Low Risk  (03/02/2022)   Overall Financial Resource Strain (CARDIA)    Difficulty of Paying Living Expenses: Not hard at all  Food Insecurity: No Food Insecurity (03/02/2022)   Hunger Vital Sign    Worried About Running Out of Food in the Last Year: Never true    [place] in the Last Year: Never true  Transportation Needs: No Transportation Needs (03/02/2022)   PRAPARE - Hydrologist (Medical): No    Lack of Transportation (Non-Medical): No  Physical Activity: Inactive (03/02/2022)   Exercise Vital Sign    Days of Exercise per Week: 0 days    Minutes of Exercise per Session: 0 min  Stress: No Stress Concern Present (03/02/2022)   [place]    Feeling of Stress : Not at all  Social Connections: Socially Isolated  (03/02/2022)   Social Connection and Isolation Panel [NHANES]    Frequency of Communication with Friends and Family: More than three times a week    Frequency of Social Gatherings with Friends and Family: Twice a week    Attends Religious Services: Never    Marine scientist or Organizations: No    Attends Music therapist: Never    Marital Status: Never married    Family History: Family History  Problem Relation Age of Onset   Cancer Mother        breast   Heart disease Mother        "partial heart failure"   Diabetes Mother    Hypertension Mother    Hyperlipidemia Mother    Drug abuse Father    Alcohol abuse Father    Diabetes Father    Bipolar disorder Father     Allergies: No Known Allergies  Medications Prior to Admission  Medication Sig Dispense Refill Last Dose   Prenatal Vit-Fe Fumarate-FA (PRENATAL VITAMIN PO) Take by mouth.   09/12/2022   Blood Pressure Monitor MISC For regular home bp monitoring during pregnancy 1 each 0      Review of Systems   All systems reviewed and negative except as stated in HPI  Blood pressure 135/71, pulse (!) 106, temperature 98.4 F (36.9 C), temperature source Oral, resp. rate 18, height 5\' 1"  (1.549 m), weight 89.9 kg, last menstrual period 11/27/2021, SpO2 98 %. General appearance: alert, cooperative, and appears stated age Lungs: clear to auscultation bilaterally Heart: regular rate and rhythm Abdomen: soft, non-tender; bowel sounds normal Extremities: Homans sign is negative, no sign of DVT Presentation: cephalic Fetal monitoringBaseline: 140 bpm, Variability: Fair (1-6 bpm), Accelerations: Reactive, and Decelerations: Had some mild lates Uterine activityFrequency: Every 6-8 minutes Dilation: Closed Effacement (%): Thick Exam by:: Mercado-Ortiz   Prenatal labs: ABO, Rh: B/Positive/-- (05/04 1100) Antibody: Negative (08/07 0803) Rubella: <0.90 (05/04 1100) RPR: Non Reactive (08/07 0803)  HBsAg:  Negative (05/04 1100)  HIV: Non Reactive (08/07 0803)  GBS: Negative/-- (10/18 1503)  1 hr Glucola nml Genetic screening  nml Anatomy US nml  Prenatal Transfer Tool  Maternal Diabetes: No Genetic Screening: Normal Maternal Ultrasounds/Referrals: Normal Fetal Ultrasounds or other Referrals:  None Maternal Substance Abuse:  No Significant Maternal Medications:  None Significant Maternal Lab Results:  Group B Strep negative Number of Prenatal Visits:greater than 3 verified prenatal visits Other Comments:  None  Results for orders placed or performed during the hospital encounter of 09/13/22 (from the past 24 hour(s))  Amnisure rupture of membrane (rom)not at [facility]   Collection Time: 09/13/22  2:33 PM  Result Value Ref Range   Amnisure ROM NEGATIVE     Patient Active Problem List   Diagnosis Date Noted   Indication for care in labor or delivery 09/13/2022   Rubella non-immune status, antepartum 05/05/2022   Supervision of normal first pregnancy 03/02/2022   Vitamin D deficiency 06/28/2021   Family history of breast cancer 06/27/2021    Assessment/Plan:  KANYAH DORY is a 21 y.o. G1P0000 at [redacted]w[redacted]d here for DFM  #Labor:Cervix closed. Pt not reporting painful contractions currently. Due to Cat 2 strip, will start 25/25 PO/vag Cytotec. Will recheck in 4 hrs and potentially place foley bulb.  #Pain: Desires epidural eventually #FWB: Cat 2 #ID:  GBS neg #MOF: Breast #MOC: Declined  Wilhemina Cash, MD PGY-1 Family Medicine Resident [place]

## 2022-09-13 NOTE — Progress Notes (Signed)
Kaitlyn Meyer is a 21 y.o. G1P0000 at [redacted]w[redacted]d by LMP admitted for induction of labor due to DFM/NRAT.  Subjective: Pt resting comfortably, not feeling her contractions, no urge to push.   Objective: BP 112/69   Pulse 98   Temp 98.2 F (36.8 C) (Oral)   Resp 18   Ht 5\' 1"  (1.549 m)   Wt 89.9 kg   LMP 11/27/2021 (Exact Date)   SpO2 99%   BMI 37.45 kg/m  No intake/output data recorded. No intake/output data recorded.  FHT:  FHR: 155 bpm, variability: moderate,  accelerations:  Present,  decelerations:  Absent UC:   irregular, every 3-8 minutes   SVE:   Dilation: Closed Effacement (%): Thick Exam by:: MD Mendelow (Confrimed presentation by [email or web address] for cytoec placement)  Labs: Lab Results  Component Value Date   WBC 8.6 09/13/2022   HGB 13.0 09/13/2022   HCT 41.4 09/13/2022   MCV 92.8 09/13/2022   PLT 185 09/13/2022    Assessment / Plan: IOL due to DFM and NRAT  IOL:  progressing as expected. S/p 25/25 PO/Vag cytotec. Plan for [birth year]19[birth year]1977 buccal now that 4hrs later.  Fetal Wellbeing:  Category I Pain Control:  Labor support without medications I/D:   GBS neg Anticipated MOD:  NSVD  , MD PGY3 Family Medicine Resident [facility] [place]  09/13/2022, 8:35 PM

## 2022-09-14 ENCOUNTER — Inpatient Hospital Stay (HOSPITAL_COMMUNITY): Payer: Medicaid Other | Admitting: Anesthesiology

## 2022-09-14 ENCOUNTER — Other Ambulatory Visit: Payer: Medicaid Other

## 2022-09-14 ENCOUNTER — Encounter: Payer: Self-pay | Admitting: *Deleted

## 2022-09-14 LAB — CBC WITH DIFFERENTIAL/PLATELET
Abs Immature Granulocytes: 0.06 10*3/uL (ref 0.00–0.07)
Basophils Absolute: 0 10*3/uL (ref 0.0–0.1)
Basophils Relative: 0 %
Eosinophils Absolute: 0 10*3/uL (ref 0.0–0.5)
Eosinophils Relative: 0 %
HCT: 40.6 % (ref 36.0–46.0)
Hemoglobin: 13.6 g/dL (ref 12.0–15.0)
Immature Granulocytes: 0 %
Lymphocytes Relative: 9 %
Lymphs Abs: 1.2 10*3/uL (ref 0.7–4.0)
MCH: 29.9 pg (ref 26.0–34.0)
MCHC: 33.5 g/dL (ref 30.0–36.0)
MCV: 89.2 fL (ref 80.0–100.0)
Monocytes Absolute: 0.9 10*3/uL (ref 0.1–1.0)
Monocytes Relative: 7 %
Neutro Abs: 11.6 10*3/uL — ABNORMAL HIGH (ref 1.7–7.7)
Neutrophils Relative %: 84 %
Platelets: 191 10*3/uL (ref 150–400)
RBC: 4.55 MIL/uL (ref 3.87–5.11)
RDW: 16.1 % — ABNORMAL HIGH (ref 11.5–15.5)
WBC: 13.9 10*3/uL — ABNORMAL HIGH (ref 4.0–10.5)
nRBC: 0 % (ref 0.0–0.2)

## 2022-09-14 LAB — COMPREHENSIVE METABOLIC PANEL
ALT: 14 U/L (ref 0–44)
AST: 24 U/L (ref 15–41)
Albumin: 2.7 g/dL — ABNORMAL LOW (ref 3.5–5.0)
Alkaline Phosphatase: 96 U/L (ref 38–126)
Anion gap: 11 (ref 5–15)
BUN: 8 mg/dL (ref 6–20)
CO2: 19 mmol/L — ABNORMAL LOW (ref 22–32)
Calcium: 9 mg/dL (ref 8.9–10.3)
Chloride: 111 mmol/L (ref 98–111)
Creatinine, Ser: 0.73 mg/dL (ref 0.44–1.00)
GFR, Estimated: >60 mL/min (ref >60)
Glucose, Bld: 80 mg/dL (ref 70–99)
Potassium: 3.9 mmol/L (ref 3.5–5.1)
Sodium: 141 mmol/L (ref 135–145)
Total Bilirubin: 0.5 mg/dL (ref 0.3–1.2)
Total Protein: 6.3 g/dL — ABNORMAL LOW (ref 6.5–8.1)

## 2022-09-14 LAB — RPR: RPR Ser Ql: NONREACTIVE

## 2022-09-14 LAB — PROTEIN / CREATININE RATIO, URINE
Creatinine, Urine: 18 mg/dL
Protein Creatinine Ratio: 2.67 mg/mg{Cre} — ABNORMAL HIGH (ref 0.00–0.15)
Total Protein, Urine: 48 mg/dL

## 2022-09-14 MED ORDER — ACETAMINOPHEN 500 MG PO TABS
1000.0000 mg | ORAL_TABLET | Freq: Four times a day (QID) | ORAL | Status: DC | PRN
  Administered 2022-09-14: 1000 mg via ORAL
  Filled 2022-09-14: qty 2

## 2022-09-14 MED ORDER — GENTAMICIN SULFATE 40 MG/ML IJ SOLN
5.0000 mg/kg | INTRAVENOUS | Status: DC
  Administered 2022-09-14: 320 mg via INTRAVENOUS
  Filled 2022-09-14: qty 8

## 2022-09-14 MED ORDER — PHENYLEPHRINE 80 MCG/ML (10ML) SYRINGE FOR IV PUSH (FOR BLOOD PRESSURE SUPPORT)
80.0000 ug | PREFILLED_SYRINGE | INTRAVENOUS | Status: DC | PRN
  Administered 2022-09-14: 80 ug via INTRAVENOUS
  Filled 2022-09-14: qty 10

## 2022-09-14 MED ORDER — DIPHENHYDRAMINE HCL 50 MG/ML IJ SOLN: 12.5000 mg | INTRAMUSCULAR | Status: DC | PRN

## 2022-09-14 MED ORDER — EPHEDRINE 5 MG/ML INJ: 10.0000 mg | INTRAVENOUS | Status: DC | PRN

## 2022-09-14 MED ORDER — SODIUM CHLORIDE 0.9 % IV SOLN
2.0000 g | Freq: Four times a day (QID) | INTRAVENOUS | Status: DC
  Administered 2022-09-14: 2 g via INTRAVENOUS
  Filled 2022-09-14: qty 2000

## 2022-09-14 MED ORDER — MISOPROSTOL 50MCG HALF TABLET
50.0000 ug | ORAL_TABLET | Freq: Once | ORAL | Status: AC
  Administered 2022-09-14: 50 ug via BUCCAL
  Filled 2022-09-14: qty 1

## 2022-09-14 MED ORDER — FENTANYL-BUPIVACAINE-NACL 0.5-0.125-0.9 MG/250ML-% EP SOLN
12.0000 mL/h | EPIDURAL | Status: DC | PRN
  Administered 2022-09-14 – 2022-09-15 (×2): 12 mL/h via EPIDURAL
  Filled 2022-09-14 (×2): qty 250

## 2022-09-14 MED ORDER — ACETAMINOPHEN 325 MG PO TABS
650.0000 mg | ORAL_TABLET | Freq: Once | ORAL | Status: AC
  Administered 2022-09-14: 650 mg via ORAL
  Filled 2022-09-14: qty 2

## 2022-09-14 MED ORDER — LIDOCAINE HCL (PF) 1 % IJ SOLN
INTRAMUSCULAR | Status: DC | PRN
  Administered 2022-09-14: 5 mL via EPIDURAL
  Administered 2022-09-14: 3 mL via EPIDURAL

## 2022-09-14 MED ORDER — PHENYLEPHRINE 80 MCG/ML (10ML) SYRINGE FOR IV PUSH (FOR BLOOD PRESSURE SUPPORT): 80.0000 ug | PREFILLED_SYRINGE | INTRAVENOUS | Status: DC | PRN

## 2022-09-14 MED ORDER — LACTATED RINGERS IV SOLN
500.0000 mL | Freq: Once | INTRAVENOUS | Status: AC
  Administered 2022-09-14: 500 mL via INTRAVENOUS

## 2022-09-14 MED ORDER — ONDANSETRON HCL 4 MG/2ML IJ SOLN
4.0000 mg | INTRAMUSCULAR | Status: DC | PRN
  Administered 2022-09-14: 4 mg via INTRAVENOUS
  Filled 2022-09-14: qty 2

## 2022-09-14 NOTE — Progress Notes (Signed)
Kaitlyn Meyer is a 21 y.o. G1P0000 at [redacted]w[redacted]d by ultrasound admitted for induction of labor due to Decreased Fetal movement.  Subjective: Patient pleasant and doing well. Introductions exchanged. Patient happy that FB is out and is excited to meet baby Girl.   Objective: BP 134/82   Pulse 97   Temp 98.1 F (36.7 C) (Oral)   Resp 18   Ht 5\' 1"  (1.549 m)   Wt 89.9 kg   LMP 11/27/2021 (Exact Date)   SpO2 95%   BMI 37.45 kg/m  No intake/output data recorded. No intake/output data recorded.  FHT:  FHR: 135 bpm, variability: moderate,  accelerations:  Present,  decelerations:  Present variables with contractions and quick return to baseline.  UC:   irregular, every 2-4 minutes SVE:   Dilation: 4 Effacement (%): 60 Station: -3 Exam by:: [email or web address], CNM  Labs: Lab Results  Component Value Date   WBC 8.6 09/13/2022   HGB 13.0 09/13/2022   HCT 41.4 09/13/2022   MCV 92.8 09/13/2022   PLT 185 09/13/2022   CNM to bedside to discuss initiation of Pit with patient and family. Risk and benefits discussed and all questions answered. Patient would like to proceed with initiation of Pit.   Assessment / Plan: Induction of labor due to Decreased fetal movement at [redacted]w[redacted]d,  progressing well s/p Cytotec FB and SROM. Light mec stained fluid still observed.   Labor: [redacted]w[redacted]d. Continue to titrate up appropriately as needed.  Fetal Wellbeing:  Category II Pain Control:  IV pain meds Patient considering Epidural placement. Patient may have epidural upon request.  I/D:   GBS Negative  Anticipated MOD:  NSVD  Designer, television/film set, CNM 09/14/2022, 10:23 AM

## 2022-09-14 NOTE — Progress Notes (Signed)
Kaitlyn Meyer is a 21 y.o. G1P0000 at [redacted]w[redacted]d admitted for induction of labor due to decreased fetal movement.  Subjective: Pt resting, feeling her contractions but reports they are not too painful. No LOF or VB.   Objective: BP 121/83   Pulse 83   Temp 97.9 F (36.6 C) (Oral)   Resp 16   Ht 5\' 1"  (1.549 m)   Wt 89.9 kg   LMP 11/27/2021 (Exact Date)   SpO2 99%   BMI 37.45 kg/m  No intake/output data recorded. No intake/output data recorded.  FHT:  FHR: 150 bpm, variability: moderate,  accelerations:  Present,  decelerations:  Absent UC:   regular, every 1-3 minutes  SVE:   Dilation: Closed Effacement (%): 20 Station: -3 Exam by:: [email or web address] RN  Labs: Lab Results  Component Value Date   WBC 8.6 09/13/2022   HGB 13.0 09/13/2022   HCT 41.4 09/13/2022   MCV 92.8 09/13/2022   PLT 185 09/13/2022    Assessment / Plan: IOL for DFM/NRAT  IOL: progressing on cytotec. Hopeful for foley bulb next cervical check.  Fetal Wellbeing:  Category I Pain Control:  Labor support without medications I/D:   GBS neg Anticipated MOD:  NSVD  09/15/2022, MD PGY3 Family Medicine Resident [facility] [place] 09/14/2022, 1:37 AM

## 2022-09-14 NOTE — Progress Notes (Signed)
Kaitlyn Meyer is a 21 y.o. G1P0000 at [redacted]w[redacted]d by ultrasound admitted for induction of labor due to decreased fetal movement .  Subjective: Patient now comfortable with epidural.   Objective: BP (!) 107/56   Pulse 65   Temp 98 F (36.7 C) (Oral)   Resp 16   Ht 5\' 1"  (1.549 m)   Wt 89.9 kg   LMP 11/27/2021 (Exact Date)   SpO2 99%   BMI 37.45 kg/m  No intake/output data recorded. No intake/output data recorded.  FHT:  FHR: 140 bpm, variability: moderate,  accelerations:  Present,  decelerations:  Present early decels with contractions  UC:   regular, every 4-5 minutes SVE:   Dilation: 5 Effacement (%): 70 Station: 0 Exam by:: [email or web address], CNM  Labs: Lab Results  Component Value Date   WBC 8.6 09/13/2022   HGB 13.0 09/13/2022   HCT 41.4 09/13/2022   MCV 92.8 09/13/2022   PLT 185 09/13/2022    Assessment / Plan: Induction of labor due to decreased fetal movement,  progressing well on pitocin s/p cytotecx, FB and SROM.   Labor: Progressing on Pitocin, continue to titrate up 2x2 as needed.  Fetal Wellbeing:  Category II Pain Control:  Epidural I/D:   GBS Negative  Anticipated MOD:  NSVD  09/15/2022, CNM 09/14/2022, 3:52 PM

## 2022-09-14 NOTE — Progress Notes (Signed)
Kaitlyn Meyer is a 21 y.o. G1P0000 at [redacted]w[redacted]d admitted for induction of labor due to DFM/Cat 2 Strip.  Subjective: Pt with epidural, feeling pelvic pressure, feeling febrile/chills with nausea.   Objective: BP 132/60   Pulse (!) 101   Temp (!) 101.5 F (38.6 C) (Axillary)   Resp 17   Ht 5\' 1"  (1.549 m)   Wt 89.9 kg   LMP 11/27/2021 (Exact Date)   SpO2 99%   BMI 37.45 kg/m  I/O last 3 completed shifts: In: -  Out: 1200 [Urine:1200] Total I/O In: -  Out: 300 [Urine:300]  FHT:  FHR: 150 bpm, variability: moderate,  accelerations:  Present,  decelerations:  Absent UC:   >200MVU  SVE:   Dilation: Lip/rim Effacement (%): 90 Station: 0, Plus 1 Exam by:: [email or web address], RN  Labs: Lab Results  Component Value Date   WBC 13.9 (H) 09/14/2022   HGB 13.6 09/14/2022   HCT 40.6 09/14/2022   MCV 89.2 09/14/2022   PLT 191 09/14/2022    Assessment / Plan: IOL for DFM/Cat II strip.   Labor: Progressing normally, continue Pitocin. III: amp/gent ongoing  Fetal Wellbeing:  Category I Pain Control:  Epidural I/D:   GBS negative but now with III Anticipated MOD:  NSVD  09/16/2022, MD PGY3 Family Medicine Resident  [facility] [place]  09/14/2022, 11:09 PM

## 2022-09-14 NOTE — Progress Notes (Signed)
Pt given 1000 mg of tylenol at 2117 for maternal fever of 100.7. At 2120 the pt vomited. No visual signs of the medicine was noted in the vomit. Dr. Noe Gens notified.

## 2022-09-14 NOTE — Anesthesia Procedure Notes (Signed)
Epidural Patient location during procedure: OB Start time: 09/14/2022 11:19 AM End time: 09/14/2022 11:23 AM  Staffing Anesthesiologist: Linton Rump, MD Performed: anesthesiologist   Preanesthetic Checklist Completed: patient identified, IV checked, site marked, risks and benefits discussed, surgical consent, monitors and equipment checked, pre-op evaluation and timeout performed  Epidural Patient position: sitting Prep: DuraPrep and site prepped and draped Patient monitoring: continuous pulse ox and blood pressure Approach: midline Location: L3-L4 Injection technique: LOR saline  Needle:  Needle type: Tuohy  Needle gauge: 17 G Needle length: 9 cm and 9 Needle insertion depth: 6 cm Catheter type: closed end flexible Catheter size: 19 Gauge Catheter at skin depth: 10 cm Test dose: negative  Assessment Events: blood not aspirated, injection not painful, no injection resistance, no paresthesia and negative IV test  Additional Notes The patient has requested an epidural for labor pain management. Risks and benefits including, but not limited to, infection, bleeding, local anesthetic toxicity, headache, hypotension, back pain, block failure, etc. were discussed with the patient. The patient expressed understanding and consented to the procedure. I confirmed that the patient has no bleeding disorders and is not taking blood thinners. I confirmed the patient's last platelet count with the nurse. A time-out was performed immediately prior to the procedure. Please see nursing documentation for vital signs. Sterile technique was used throughout the whole procedure. Once LOR achieved, the epidural catheter threaded easily without resistance. Aspiration of the catheter was negative for blood and CSF. The epidural was dosed slowly and an infusion was started.  1 attempt(s)Reason for block:procedure for pain

## 2022-09-14 NOTE — Progress Notes (Signed)
Kaitlyn Meyer is a 21 y.o. G1P0000 at [redacted]w[redacted]d admitted for DFM/NRAT.   Subjective: Pt feeling her contractions, rating them 7/10. Breathing through them.   Objective: BP 131/78   Pulse 63   Temp 98 F (36.7 C) (Oral)   Resp 19   Ht 5\' 1"  (1.549 m)   Wt 89.9 kg   LMP 11/27/2021 (Exact Date)   SpO2 99%   BMI 37.45 kg/m  No intake/output data recorded. No intake/output data recorded.  FHT:  FHR: 150 bpm, variability: moderate,  accelerations:  Present,  decelerations:  Absent UC:   regular, every 1-3 minutes  SVE:   Dilation: 1 Effacement (%): Thick Station: -3 Exam by:: [email or web address], CNM  Labs: Lab Results  Component Value Date   WBC 8.6 09/13/2022   HGB 13.0 09/13/2022   HCT 41.4 09/13/2022   MCV 92.8 09/13/2022   PLT 185 09/13/2022    Assessment / Plan: IOL for DFM/NRAT.  IOL: SROM at 1:40am. Pt 1/thick/-3. FB placed 0500am. Plan for pitocen when able pending pain tolerance/contraction pattern.  Fetal Wellbeing:  Category I Pain Control:  IV pain meds I/D:   GBS neg Anticipated MOD:  NSVD  09/15/2022, MD PGY3 Family Medicine Resident [facility] [place]  09/14/2022, 5:04 AM

## 2022-09-14 NOTE — Progress Notes (Signed)
Patient Vitals for the past 4 hrs:  BP Temp Temp src Pulse Resp  09/14/22 2106 -- (!) 100.7 F (38.2 C) Axillary -- --  09/14/22 2100 (!) 147/76 -- -- (!) 110 19  09/14/22 2030 129/65 -- -- (!) 104 --  09/14/22 2000 (!) 125/55 -- -- 80 18  09/14/22 1950 -- 99.9 F (37.7 C) Axillary -- --  09/14/22 1930 128/63 -- -- 88 --  09/14/22 1900 (!) 132/57 -- -- 79 --  09/14/22 1834 -- 100.1 F (37.8 C) Axillary -- --  09/14/22 1833 131/63 99.9 F (37.7 C) Oral 85 --  09/14/22 1801 125/60 -- -- 77 --  09/14/22 1739 (!) 129/58 -- -- 88 17   Feeling some pressure.  Cx 7/90/0.  FHR 150s, Cat 1.  Ctx q 2-4 minutes, pitocin at 14 mu/min. IUPC placed.    Now w/dx of Triple I.  Will start amp/gent and APAP.

## 2022-09-14 NOTE — Anesthesia Preprocedure Evaluation (Addendum)
Anesthesia Evaluation  Patient identified by MRN, date of birth, ID band Patient awake    Reviewed: Allergy & Precautions, NPO status , Patient's Chart, lab work & pertinent test results  History of Anesthesia Complications Negative for: history of anesthetic complications  Airway Mallampati: III  TM Distance: >3 FB Neck ROM: Full    Dental no notable dental hx.    Pulmonary former smoker   Pulmonary exam normal breath sounds clear to auscultation       Cardiovascular negative cardio ROS  Rhythm:Regular Rate:Normal     Neuro/Psych  PSYCHIATRIC DISORDERS Anxiety Depression       GI/Hepatic Neg liver ROS,GERD (uses Tums)  ,,  Endo/Other  negative endocrine ROS    Renal/GU negative Renal ROS     Musculoskeletal   Abdominal   Peds  Hematology negative hematology ROS (+)   Anesthesia Other Findings   Reproductive/Obstetrics (+) Pregnancy                             Anesthesia Physical Anesthesia Plan  ASA: 2  Anesthesia Plan: Epidural   Post-op Pain Management:    Induction:   PONV Risk Score and Plan: 2  Airway Management Planned: Natural Airway  Additional Equipment:   Intra-op Plan:   Post-operative Plan:   Informed Consent: I have reviewed the patients History and Physical, chart, labs and discussed the procedure including the risks, benefits and alternatives for the proposed anesthesia with the patient or authorized representative who has indicated his/her understanding and acceptance.       Plan Discussed with: CRNA and Anesthesiologist  Anesthesia Plan Comments: (I have discussed risks of neuraxial anesthesia including but not limited to infection, bleeding, nerve injury, back pain, headache, seizures, and failure of block. Patient denies bleeding disorders and is not currently anticoagulated. Labs have been reviewed. Risks and benefits discussed. All patient's  questions answered.  )       Anesthesia Quick Evaluation

## 2022-09-14 NOTE — Progress Notes (Signed)
Pharmacy Antibiotic Note  Kaitlyn Meyer is a 21 y.o. female admitted on 09/13/2022 for IOL at 40+ weeks for DFM.  Pharmacy has been consulted for Gentamicin dosing for chorioamnionitis/Triple I  Plan: Gentamicin 320mg  (5mg /kg) IV q24h Will continue to follow  Height: 5\' 1"  (154.9 cm) Weight: 89.9 kg (198 lb 3.2 oz) IBW/kg (Calculated) : 47.8 Adjusted/Dosing weight: 64.6kg  Temp (24hrs), Avg:98.8 F (37.1 C), Min:97.7 F (36.5 C), Max:100.7 F (38.2 C)  Recent Labs  Lab 09/13/22 1533 09/14/22 1628  WBC 8.6 13.9*  CREATININE  --  0.73    Estimated Creatinine Clearance: 112.5 mL/min (by C-G formula based on SCr of 0.73 mg/dL).    No Known Allergies  Antimicrobials this admission: Ampicillin 2 gram IV q6h 11/16 >>   Thank you for allowing pharmacy to be a part of this patient's care.  09/15/22 09/14/2022 9:14 PM

## 2022-09-15 ENCOUNTER — Encounter (HOSPITAL_COMMUNITY): Payer: Self-pay | Admitting: Family Medicine

## 2022-09-15 DIAGNOSIS — O480 Post-term pregnancy: Secondary | ICD-10-CM

## 2022-09-15 DIAGNOSIS — Z3A40 40 weeks gestation of pregnancy: Secondary | ICD-10-CM

## 2022-09-15 DIAGNOSIS — O411231 Chorioamnionitis, third trimester, fetus 1: Secondary | ICD-10-CM

## 2022-09-15 LAB — CBC
HCT: 36.3 % (ref 36.0–46.0)
Hemoglobin: 11.9 g/dL — ABNORMAL LOW (ref 12.0–15.0)
MCH: 29.3 pg (ref 26.0–34.0)
MCHC: 32.8 g/dL (ref 30.0–36.0)
MCV: 89.4 fL (ref 80.0–100.0)
Platelets: 167 10*3/uL (ref 150–400)
RBC: 4.06 MIL/uL (ref 3.87–5.11)
RDW: 16.3 % — ABNORMAL HIGH (ref 11.5–15.5)
WBC: 21.9 10*3/uL — ABNORMAL HIGH (ref 4.0–10.5)
nRBC: 0 % (ref 0.0–0.2)

## 2022-09-15 MED ORDER — SIMETHICONE 80 MG PO CHEW: 80.0000 mg | CHEWABLE_TABLET | ORAL | Status: DC | PRN

## 2022-09-15 MED ORDER — ACETAMINOPHEN 325 MG PO TABS
650.0000 mg | ORAL_TABLET | ORAL | Status: DC | PRN
  Administered 2022-09-16: 650 mg via ORAL
  Filled 2022-09-15: qty 2

## 2022-09-15 MED ORDER — MEASLES, MUMPS & RUBELLA VAC IJ SOLR: 0.5000 mL | Freq: Once | INTRAMUSCULAR | Status: DC

## 2022-09-15 MED ORDER — PRENATAL MULTIVITAMIN CH
1.0000 | ORAL_TABLET | Freq: Every day | ORAL | Status: DC
  Administered 2022-09-15 – 2022-09-17 (×3): 1 via ORAL
  Filled 2022-09-15 (×3): qty 1

## 2022-09-15 MED ORDER — SENNOSIDES-DOCUSATE SODIUM 8.6-50 MG PO TABS
2.0000 | ORAL_TABLET | ORAL | Status: DC
  Administered 2022-09-15 – 2022-09-17 (×3): 2 via ORAL
  Filled 2022-09-15 (×3): qty 2

## 2022-09-15 MED ORDER — SODIUM CHLORIDE 0.9 % IV SOLN: 250.0000 mL | INTRAVENOUS | Status: DC | PRN

## 2022-09-15 MED ORDER — BUPIVACAINE HCL (PF) 0.25 % IJ SOLN
INTRAMUSCULAR | Status: DC | PRN
  Administered 2022-09-15: 8 mL via EPIDURAL

## 2022-09-15 MED ORDER — ONDANSETRON HCL 4 MG/2ML IJ SOLN: 4.0000 mg | INTRAMUSCULAR | Status: DC | PRN

## 2022-09-15 MED ORDER — LIDOCAINE-EPINEPHRINE (PF) 2 %-1:200000 IJ SOLN
INTRAMUSCULAR | Status: DC | PRN
  Administered 2022-09-15: 6 mL via EPIDURAL

## 2022-09-15 MED ORDER — WITCH HAZEL-GLYCERIN EX PADS: 1.0000 | MEDICATED_PAD | CUTANEOUS | Status: DC | PRN

## 2022-09-15 MED ORDER — SODIUM CHLORIDE 0.9% FLUSH
3.0000 mL | INTRAVENOUS | Status: DC | PRN
  Administered 2022-09-15: 3 mL via INTRAVENOUS

## 2022-09-15 MED ORDER — ONDANSETRON HCL 4 MG PO TABS: 4.0000 mg | ORAL_TABLET | ORAL | Status: DC | PRN

## 2022-09-15 MED ORDER — TETANUS-DIPHTH-ACELL PERTUSSIS 5-2.5-18.5 LF-MCG/0.5 IM SUSY: 0.5000 mL | PREFILLED_SYRINGE | Freq: Once | INTRAMUSCULAR | Status: DC

## 2022-09-15 MED ORDER — COCONUT OIL OIL: 1.0000 | TOPICAL_OIL | Status: DC | PRN

## 2022-09-15 MED ORDER — DIPHENHYDRAMINE HCL 25 MG PO CAPS: 25.0000 mg | ORAL_CAPSULE | Freq: Four times a day (QID) | ORAL | Status: DC | PRN

## 2022-09-15 MED ORDER — DIBUCAINE (PERIANAL) 1 % EX OINT: 1.0000 | TOPICAL_OINTMENT | CUTANEOUS | Status: DC | PRN

## 2022-09-15 MED ORDER — IBUPROFEN 600 MG PO TABS
600.0000 mg | ORAL_TABLET | Freq: Four times a day (QID) | ORAL | Status: DC
  Administered 2022-09-15 – 2022-09-17 (×10): 600 mg via ORAL
  Filled 2022-09-15 (×10): qty 1

## 2022-09-15 MED ORDER — SODIUM CHLORIDE 0.9% FLUSH: 3.0000 mL | Freq: Two times a day (BID) | INTRAVENOUS | Status: DC

## 2022-09-15 MED ORDER — BENZOCAINE-MENTHOL 20-0.5 % EX AERO
1.0000 | INHALATION_SPRAY | CUTANEOUS | Status: DC | PRN
  Administered 2022-09-15 – 2022-09-17 (×2): 1 via TOPICAL
  Filled 2022-09-15 (×2): qty 56

## 2022-09-15 NOTE — Lactation Note (Signed)
This note was copied from a baby's chart. Lactation Consultation Note  Patient Name: Kaitlyn Meyer Date: 09/15/2022 Reason for consult: Initial assessment;Term;1st time breastfeeding Age:22 hours Infant had one stool since birth. Per Birth Parent,  she feels breastfeeding is going well. Birth Parent latched infant on her left breast using the cross cradle hold, infant latched with depth, sustaining latch and was still breastfeeding after 12 minutes when LC left the room. Birth Parent knows if infant is still cuing after latching on 1st breast to offer infant the 2nd breast during the same feeding. Birth Parent knows to call RN/LC for further latch assistance if needed. Birth Parent will continue to BF infant according to hunger cues, 8 to 12+ times within 24 hours, STS. Mom made aware of O/P services, breastfeeding support groups, community resources, and our phone # for post-discharge questions.   Maternal Data Has patient been taught Hand Expression?: Yes  Feeding Mother's Current Feeding Choice: Breast Milk  LATCH Score Latch: Grasps breast easily, tongue down, lips flanged, rhythmical sucking.  Audible Swallowing: A few with stimulation  Type of Nipple: Everted at rest and after stimulation  Comfort (Breast/Nipple): Soft / non-tender  Hold (Positioning): Assistance needed to correctly position infant at breast and maintain latch.  LATCH Score: 8   Lactation Tools Discussed/Used    Interventions Interventions: Breast feeding basics reviewed;Assisted with latch;Skin to skin;Support pillows;Adjust position;Position options;Hand express;Breast compression;Education;LC Services brochure  Discharge Pump: Personal;Hands Free  Consult Status Consult Status: Follow-up Date: 09/16/22 Follow-up type: In-patient    Frederico Hamman 09/15/2022, 5:31 PM

## 2022-09-15 NOTE — Anesthesia Postprocedure Evaluation (Signed)
Anesthesia Post Note  Patient: Kaitlyn Meyer  Procedure(s) Performed: AN AD HOC LABOR EPIDURAL     Patient location during evaluation: Mother Baby Anesthesia Type: Epidural Level of consciousness: awake and alert Pain management: pain level controlled Vital Signs Assessment: post-procedure vital signs reviewed and stable Respiratory status: spontaneous breathing, nonlabored ventilation and respiratory function stable Cardiovascular status: stable Postop Assessment: no headache, no backache, epidural receding, no apparent nausea or vomiting, patient able to bend at knees, able to ambulate and adequate PO intake Anesthetic complications: no   No notable events documented.  Last Vitals:  Vitals:   09/15/22 0450 09/15/22 0607  BP: 112/60 111/70  Pulse: 88 85  Resp: 18 18  Temp: 36.9 C 37.1 C  SpO2: 97% 98%    Last Pain:  Vitals:   09/15/22 0607  TempSrc: Oral  PainSc: 0-No pain   Pain Goal:                   [place]

## 2022-09-15 NOTE — Lactation Note (Signed)
This note was copied from a baby's chart. Lactation Consultation Note  Patient Name: Kaitlyn Meyer Date: 09/15/2022 Reason for consult: L&D Initial assessment;1st time breastfeeding;Primapara;Term Age:22 hours   Initial L&D Consult:  Visited with family > 1 hour after birth Assisted to latch, however, baby was only interested in taking a few sucks; very sleepy.  Reassured mother that lactation assistance will be available on the M/B unit.  Visitors present.   Maternal Data    Feeding    LATCH Score Latch: Repeated attempts needed to sustain latch, nipple held in mouth throughout feeding, stimulation needed to elicit sucking reflex.  Audible Swallowing: None  Type of Nipple: Everted at rest and after stimulation  Comfort (Breast/Nipple): Soft / non-tender  Hold (Positioning): Assistance needed to correctly position infant at breast and maintain latch.  LATCH Score: 6   Lactation Tools Discussed/Used    Interventions Interventions: Skin to skin;Assisted with latch  Discharge    Consult Status Consult Status: Follow-up from L&D    Sheina Mcleish R Amoni Morales 09/15/2022, 4:06 AM

## 2022-09-15 NOTE — Discharge Summary (Signed)
Postpartum Discharge Summary  Date of Service updated***     Patient Name: Kaitlyn Meyer DOB: 11/16/2000 MRN: [id number]  Date of admission: 09/13/2022 Delivery date:09/15/2022  Delivering provider: Christin Fudge  Date of discharge: 09/15/2022  Admitting diagnosis: Indication for care in labor or delivery [O75.9] Intrauterine pregnancy: [redacted]w[redacted]d    Secondary diagnosis:  Principal Problem:   Decreased fetal movement Active Problems:   Supervision of normal first pregnancy   Rubella non-immune status, antepartum   Indication for care in labor or delivery   Vaginal delivery  Additional problems: chorioamnionitis    Discharge diagnosis: Term Pregnancy Delivered                                              Post partum procedures:{Postpartum procedures:23558} Augmentation: Pitocin, Cytotec, and IP Foley Complications: Intrauterine Inflammation or infection (Chorioamniotis)  Hospital course: Induction of Labor With Vaginal Delivery   21y.o. yo G1P0000 at 450w5das admitted to the hospital 09/13/2022 for induction of labor.  Indication for induction:  dcreased fetal movement .  Patient had an labor course complicated byTriple I Membrane Rupture Time/Date: 1:40 AM ,09/14/2022   Delivery Method:Vaginal, Spontaneous  Episiotomy: None  Lacerations:  2nd degree  Details of delivery can be found in separate delivery note.  Patient had a postpartum course complicated by***. Patient is discharged home 09/15/22.  Newborn Data: Birth date:09/15/2022  Birth time:2:35 AM  Gender:Female  Living status:Living  Apgars:5 ,9  Weight:4030 g   Magnesium Sulfate received: No BMZ received: No Rhophylac:N/A MMR:{MMR:30440033} T-DaP:Given prenatally Flu: No Transfusion:{Transfusion received:30440034}  Physical exam  Vitals:   09/15/22 0241 09/15/22 0246 09/15/22 0302 09/15/22 0317  BP: 121/76 133/69 128/69 121/60  Pulse: (!) 101 98 (!) 130 90  Resp:      Temp:       TempSrc:      SpO2:      Weight:      Height:       General: {Exam; general:21111117} Lochia: {Desc; appropriate/inappropriate:30686::"appropriate"} Uterine Fundus: {Desc; firm/soft:30687} Incision: {Exam; incision:21111123} DVT Evaluation: {Exam; dvt:2111122} Labs: Lab Results  Component Value Date   WBC 13.9 (H) 09/14/2022   HGB 13.6 09/14/2022   HCT 40.6 09/14/2022   MCV 89.2 09/14/2022   PLT 191 09/14/2022      Latest Ref Rng & Units 09/14/2022    4:28 PM  CMP  Glucose 70 - 99 mg/dL 80   BUN 6 - 20 mg/dL 8   Creatinine 0.44 - 1.00 mg/dL 0.73   Sodium 135 - 145 mmol/L 141   Potassium 3.5 - 5.1 mmol/L 3.9   Chloride 98 - 111 mmol/L 111   CO2 22 - 32 mmol/L 19   Calcium 8.9 - 10.3 mg/dL 9.0   Total Protein 6.5 - 8.1 g/dL 6.3   Total Bilirubin 0.3 - 1.2 mg/dL 0.5   Alkaline Phos 38 - 126 U/L 96   AST 15 - 41 U/L 24   ALT 0 - 44 U/L 14    Edinburgh Score:     No data to display           After visit meds:  Allergies as of 09/15/2022   No Known Allergies   Med Rec must be completed prior to using this [facility]        Discharge home in stable condition Infant Feeding: {Baby  feeding:23562} Infant Disposition:{CHL IP OB HOME WITH OEVOJJ:00938} Discharge instruction: per After Visit Summary and Postpartum booklet. Activity: Advance as tolerated. Pelvic rest for 6 weeks.  Diet: {OB HWEX:93716967} Future Appointments:No future appointments. Follow up Visit:   Please schedule this patient for a In person postpartum visit in 4 weeks with the following provider: Any provider. Additional Postpartum F/U:  Low risk pregnancy complicated by:  Delivery mode:  Vaginal, Spontaneous  Anticipated Birth Control:   none   09/15/2022 Christin Fudge, CNM

## 2022-09-15 NOTE — Plan of Care (Signed)
  Problem: Education: Goal: Knowledge of Childbirth will improve Outcome: Completed/Met Goal: Ability to make informed decisions regarding treatment and plan of care will improve Outcome: Completed/Met Goal: Ability to state and carry out methods to decrease the pain will improve Outcome: Completed/Met Goal: Individualized Educational Video(s) Outcome: Completed/Met

## 2022-09-15 NOTE — Lactation Note (Signed)
This note was copied from a baby's chart. Lactation Consultation Note  Patient Name: Girl Winda Summerall KYHCW'C Date: 09/15/2022   Age:22 hours   LC Note:  Attempted to visit with family, however, staff members attending to patient.  Informed them to call when they are ready for a lactation consult.   Maternal Data    Feeding    LATCH Score                    Lactation Tools Discussed/Used    Interventions    Discharge    Consult Status      Gabrella Stroh R Sterlin Knightly 09/15/2022, 3:49 AM

## 2022-09-15 NOTE — Progress Notes (Signed)
Patient Vitals for the past 4 hrs:  BP Temp Temp src Pulse Resp  09/15/22 0125 138/72 -- -- 83 --  09/15/22 0100 (!) 158/88 -- -- 98 16  09/15/22 0030 136/74 -- -- 99 --  09/15/22 0026 -- (!) 101.1 F (38.4 C) Axillary -- --  09/15/22 0000 126/75 -- -- 90 19  09/14/22 2300 (!) 157/65 -- -- 94 18  09/14/22 2253 -- (!) 101.5 F (38.6 C) Axillary -- --  09/14/22 2230 132/60 -- -- (!) 101 --  09/14/22 2200 (!) 156/86 (!) 101.8 F (38.8 C) Axillary 99 17  09/14/22 2130 (!) 145/79 -- -- 98 --   Pt crying w/ctx pain in front of her abdomen. Feels "too weak".  Has pushed bolus over and over w/o relief.  ~ 1cm cx all around from 6-12 o'clock. FHR 160s, moderate variability, 10X10 accels, no decels. MVUs >200. Will get anesthesia to help w/pain, hopefully rest of cx will dilate and pt will feel well enough to push.

## 2022-09-16 ENCOUNTER — Inpatient Hospital Stay (HOSPITAL_COMMUNITY): Payer: Medicaid Other

## 2022-09-16 ENCOUNTER — Inpatient Hospital Stay (HOSPITAL_COMMUNITY): Admission: RE | Admit: 2022-09-16 | Payer: Medicaid Other | Source: Home / Self Care | Admitting: Family Medicine

## 2022-09-16 NOTE — Progress Notes (Addendum)
POSTPARTUM PROGRESS NOTE  Post Partum Day 1  Subjective:  Kaitlyn Meyer is a 21 y.o. G1P1001 s/p SVD at [redacted]w[redacted]d  She reports she is doing well. No acute events overnight. She denies any problems with ambulating, voiding or po intake. Denies nausea or vomiting.  Pain is well controlled.  Lochia is minimal.  Objective: Blood pressure 118/79, pulse 88, temperature 98.4 F (36.9 C), temperature source Oral, resp. rate 18, height _0  (1.549 m), weight 89.9 kg, last menstrual period 11/27/2021, SpO2 98 %, unknown if currently breastfeeding.  Physical Exam:  General: alert, cooperative and no distress Chest: no respiratory distress Heart:regular rate, distal pulses intact Abdomen: soft, nontender,  Uterine Fundus: firm, appropriately tender DVT Evaluation: No calf swelling or tenderness Extremities: 1+ edema Skin: warm, dry  Recent Labs    09/14/22 1628 09/15/22 0405  HGB 13.6 11.9*  HCT 40.6 36.3    Assessment/Plan: Kaitlyn MMARIAMAWIT DEPAOLIis a 21y.o. G1P1001 s/p SVD at 459w5d PPD#1 - Doing well Routine postpartum care Rubella Nonimmune: pp MMR today  Proteinuria: UPC 2.67. Mild range pressures during labor/delivery due to pain (epidural did not work for her). Normotensive pressures since delivery. Pt with 1+ bilateral edema. Cr on admit 0.73. F/u repeat creatinine/UPC/UA at pp visit.  Triple I: WBCup to 21.9 today but pt afebrile and asymptomatic so will not repeat. S/p amp and gent antepartum.  Contraception: declines/no partner Feeding: Breast  Dispo: Plan for discharge PPD2.   LOS: 3 days   DaSim BoastMD  PGY3 Family Medicine Resident  [place]1/18/2023, 8:19 AM   GME ATTESTATION:  I saw and evaluated the patient. I agree with the findings and the plan of care as documented in the resident's note. I have made changes to documentation as necessary.  SiGerlene FeeDO OB Fellow, Fa[place] WoPort [place]1/18/2023, 10:40 AM

## 2022-09-16 NOTE — Progress Notes (Signed)
CSW received consult for hx of Anxiety and Depression. CSW met with MOB to offer support and complete assessment. When CSW entered room, MOB's mother was present, sitting in chair nearby. MOB was observed sitting in hospital bed. Infant "Mateo Flow" was asleep on her back in bassinet. CSW introduced self and requested to speak with MOB alone. MOB provided verbal consent to complete consult with MOB's mother present. CSW explained reason for consult. MOB presented as calm, was agreeable to consult and remained engaged throughout encounter.   CSW inquired how MOB is feeling emotionally since giving birth. MOB reports she is feeling "good" and "glad (infant) is here." CSW expressed congratulations. CSW inquired about MOB's mental health history. MOB reports she was diagnosed with anxiety and depression in 2019. MOB reports she was prescribed zoloft and trazodone for sleep from 2019-2021 and met with a therapist from 2019-2021. MOB reports she is not currently on psychotropic medication and is not current with a therapist. MOB reports she feels her mental health is in a good place and declined outpatient mental health resources at this time. MOB identified her mother and best friend as supports. MOB denied current SI/HI.   MOB reports she has all needed items for infant, including a car seat and bassinet. MOB has chosen [facility] for infant's follow up care. MOB reports she receives [facility]. MOB declined additional resource needs at this time.   CSW provided education regarding the baby blues period vs. perinatal mood disorders, discussed treatment and gave resources for mental health follow up if concerns arise. CSW recommends self-evaluation during the postpartum time period using the New Mom Checklist from Postpartum Progress and encouraged MOB to contact a medical professional if symptoms are noted at any time.    CSW provided review of Sudden Infant Death Syndrome (SIDS) precautions.    CSW  identifies no further need for intervention and no barriers to discharge at this time.  Signed,  Berniece Salines, MSW, LCSWA, LCASA 09/16/2022 4:08 PM

## 2022-09-16 NOTE — Lactation Note (Signed)
This note was copied from a baby's chart. Lactation Consultation Note  Patient Name: Girl Kyara Boxer FUXNA'T Date: 09/16/2022 Reason for consult: Follow-up assessment;1st time breastfeeding;Mother's request Age:22 hours  P1, Mother requesting assistance with latching.  Baby was in laid back position.  Assisted with latching with more depth and baby was able to sustain latch.  Mother states she has more difficulty latching on her R breast.  Provided mother with  manual pump to prepump before latching and suggest she call for assistance with next feeding. Will check flange size when she calls.  Maternal Data Has patient been taught Hand Expression?: Yes Does the patient have breastfeeding experience prior to this delivery?: No  Feeding Mother's Current Feeding Choice: Breast Milk  LATCH Score Latch: Repeated attempts needed to sustain latch, nipple held in mouth throughout feeding, stimulation needed to elicit sucking reflex.  Audible Swallowing: A few with stimulation  Type of Nipple: Everted at rest and after stimulation  Comfort (Breast/Nipple): Soft / non-tender  Hold (Positioning): Assistance needed to correctly position infant at breast and maintain latch.  LATCH Score: 7   Lactation Tools Discussed/Used  Manual pump  Interventions Interventions: Breast feeding basics reviewed;Assisted with latch;Skin to skin;Pre-pump if needed;Hand pump;Education  Consult Status Consult Status: Follow-up Date: 09/17/22 Follow-up type: In-patient    Dahlia Byes [facility] 09/16/2022, 9:29 AM

## 2022-09-17 MED ORDER — ACETAMINOPHEN 325 MG PO TABS: 650.0000 mg | ORAL_TABLET | ORAL | 0 refills | Status: DC | PRN

## 2022-09-17 MED ORDER — IBUPROFEN 600 MG PO TABS: 600.0000 mg | ORAL_TABLET | Freq: Four times a day (QID) | ORAL | 0 refills | Status: DC

## 2022-09-17 NOTE — Lactation Note (Signed)
This note was copied from a baby's chart. Lactation Consultation Note  Patient Name: Kaitlyn Meyer KJZPH'X Date: 09/17/2022 Reason for consult: Follow-up assessment Age:22 hours  P1, Baby sucking on pacifier.  Pacifier use not recommended at this time. Provided education. Reviewed engorgement care and monitoring voids/stools. Mother states she is using hand pump to prepump to latch baby on Right side which is helping latch.  Maternal Data Has patient been taught Hand Expression?: Yes Does the patient have breastfeeding experience prior to this delivery?: No  Feeding Mother's Current Feeding Choice: Breast Milk  Interventions Interventions: Education  Discharge Discharge Education: Engorgement and breast care;Warning signs for feeding baby  Consult Status Consult Status: Complete Date: 09/17/22    Dahlia Byes [facility] 09/17/2022, 10:42 AM

## 2022-09-19 LAB — SURGICAL PATHOLOGY

## 2022-09-25 ENCOUNTER — Telehealth (HOSPITAL_COMMUNITY): Payer: Self-pay | Admitting: *Deleted

## 2022-09-25 NOTE — Telephone Encounter (Signed)
Hospital Discharge Follow-Up Call:  Patient reports that she is well and has no concerns about her health.  EPDS today was 3 and she endorses this accurately reflects that she is doing well emotionally.  Patient says that baby is well and they have a weight check tomorrow.  She is a bit worried about having enough milk stored for when she returns to work at the beginning of the new year.  She anticipates getting a [facility]BP from [facility] this Fri, Dec 1.  Encouraged her to talk to a peer counselor or an LC at the [facility] office for guidance on how to navigate pumping and returning to work and she said that was her plan.  She reports that baby sleeps in a bedside bassinet.  Reviewed ABCs of Safe Sleep.

## 2022-10-10 ENCOUNTER — Encounter: Payer: Self-pay | Admitting: Family Medicine

## 2022-10-19 ENCOUNTER — Encounter: Payer: Self-pay | Admitting: Advanced Practice Midwife

## 2022-10-19 ENCOUNTER — Ambulatory Visit (INDEPENDENT_AMBULATORY_CARE_PROVIDER_SITE_OTHER): Payer: Medicaid Other | Admitting: Advanced Practice Midwife

## 2022-10-19 NOTE — Progress Notes (Signed)
Post Partum Visit Note   Chief Complaint:   Postpartum Care  History of Present Illness:   Kaitlyn Meyer is a 21 y.o. G86P1001 Hispanic female being seen today for a postpartum visit. She is 5 weeks postpartum following a spontaneous vaginal delivery at 40.5 gestational weeks. IOL: Yes, for  decreased fetal movement . Anesthesia: epidural.  Laceration: 2nd degree.  Complications: none. Inpatient contraception: no.   Pregnancy uncomplicated. Tobacco use: no. Substance use disorder: no. Last pap smear: 06/27/21 and results were NILM w/ HRHPV not done. Next pap smear due: 2025 Patient's last menstrual period was 11/27/2021 (exact date).  Postpartum course has been uncomplicated. Bleeding staining only. Bowel function is normal. Bladder function is normal. Urinary incontinence? No, fecal incontinence? No Patient is not sexually active. Last sexual activity: prior to birth.    Upstream - 10/19/22 1131       Pregnancy Intention Screening   Does the patient want to become pregnant in the next year? No    Does the patient's partner want to become pregnant in the next year? No    Would the patient like to discuss contraceptive options today? No      Contraception Wrap Up   Current Method Abstinence    End Method Abstinence    Contraception Counseling Provided No            The pregnancy intention screening data noted above was reviewed. Potential methods of contraception were discussed. The patient elected to proceed with Abstinence.  Edinburgh Postpartum Depression Screening: Negative  Edinburgh Postnatal Depression Scale - 10/19/22 1134       Edinburgh Postnatal Depression Scale:  In the Past 7 Days   I have been able to laugh and see the funny side of things. 0    I have looked forward with enjoyment to things. 0    I have blamed myself unnecessarily when things went wrong. 1    I have been anxious or worried for no good reason. 2    I have felt scared or panicky for no good  reason. 1    Things have been getting on top of me. 1    I have been so unhappy that I have had difficulty sleeping. 0    I have felt sad or miserable. 0    I have been so unhappy that I have been crying. 1    The thought of harming myself has occurred to me. 0    Edinburgh Postnatal Depression Scale Total 6            Baby's course has been uncomplicated. Baby is feeding by breast: milk supply adequate. Infant has a pediatrician/family doctor? Yes.  Childcare strategy if returning to work/school: n/a.  Pt has material needs met for her and baby: Yes.   Review of Systems:   Pertinent items are noted in HPI Denies Abnormal vaginal discharge w/ itching/odor/irritation, headaches, visual changes, shortness of breath, chest pain, abdominal pain, severe nausea/vomiting, or problems with urination or bowel movements. Pertinent History Reviewed:  Reviewed past medical,surgical, obstetrical and family history.  Reviewed problem list, medications and allergies. OB History  Gravida Para Term Preterm AB Living  _0 0 0 1  SAB IAB Ectopic Multiple Live Births  0 0 0 0 1    # Outcome Date GA Lbr Len/2nd Weight Sex Delivery Anes PTL Lv  1 Term 09/15/22 14w5d24:35 / 00:20 8 lb 14.2 oz (4.03 kg) F Vag-Spont EPI, Local  LIV  Physical Assessment:   Vitals:   10/19/22 1130  BP: 111/73  Pulse: 93  Weight: 170 lb (77.1 kg)  Height: _0  (1.549 m)  Body mass index is 32.12 kg/m.  Objective:  Blood pressure 111/73, pulse 93, height _1  (1.549 m), weight 170 lb (77.1 kg), last menstrual period 11/27/2021, currently breastfeeding.  General:  alert, cooperative, and no distress   Breasts:  negative  Lungs: Normal respiratory effort  Heart:  regular rate and rhythm  Abdomen: soft, non-tender, incision well healed   Vulva:  normal  Vagina: normal vagina  Cervix:  normal  Corpus: Well involuted  Adnexa:  not evaluated  Rectal Exam: no hemorrhoids          No results found for this  or any previous visit (from the past 24 hour(s)).  Assessment & Plan:  1) Postpartum exam 2) 5 wks s/p spontaneous vaginal delivery 3) breast & bottle feeding 4) Depression screening 5) Contraception counseling  Essential components of care per ACOG recommendations:  1.  Mood and well being:  If positive depression screen, discussed and plan developed.  If using tobacco we discussed reduction/cessation and risk of relapse If current substance abuse, we discussed and referral to local resources was offered.   2. Infant care and feeding:  If breastfeeding, discussed returning to work, pumping, breastfeeding-associated pain, guidance regarding return to fertility while lactating if not using another method. If needed, patient was provided with a letter to be allowed to pump q 2-3hrs to support lactation in a private location with access to a refrigerator to store breastmilk.   Recommended that all caregivers be immunized for flu, pertussis and other preventable communicable diseases If pt does not have material needs met for her/baby, referred to local resources for help obtaining these.  3. Sexuality, contraception and birth spacing Provided guidance regarding sexuality, management of dyspareunia, and resumption of intercourse Discussed avoiding interpregnancy interval <48mhs and recommended birth spacing of 18 months  4. Sleep and fatigue Discussed coping options for fatigue and sleep disruption Encouraged family/partner/community support of 4 hrs of uninterrupted sleep to help with mood and fatigue  5. Physical recovery  If pt had a C/S, assessed incisional pain and providing guidance on normal vs prolonged recovery If pt had a laceration, perineal healing and pain reviewed.  If urinary or fecal incontinence, discussed management and referred to PT or uro/gyn if indicated  Patient is safe to resume physical activity. Discussed attainment of healthy weight.  6.  Chronic disease  management Discussed pregnancy complications if any, and their implications for future childbearing and long-term maternal health. Review recommendations for prevention of recurrent pregnancy complications, such as aspirin to reduce risk of preeclampsia not applicable. Pt had GDM: No. If yes, 2hr GTT scheduled: not applicable. Reviewed medications and non-pregnant dosing including consideration of whether pt is breastfeeding using a reliable resource such as LactMed: not applicable Referred for f/u w/ PCP or subspecialist providers as indicated: not applicable  7. Health maintenance Mammogram at [id number] earlier if indicated Pap smears as indicated  Meds: No orders of the defined types were placed in this encounter.   Follow-up: No follow-ups on file.   No orders of the defined types were placed in this encounter.      FAbseconfor WDe[employer] Company CGildfordGroup 10/19/2022 11:41 AM

## 2022-10-26 ENCOUNTER — Encounter: Payer: Self-pay | Admitting: Advanced Practice Midwife

## 2024-02-12 ENCOUNTER — Ambulatory Visit: Admitting: *Deleted

## 2024-02-12 ENCOUNTER — Other Ambulatory Visit (HOSPITAL_COMMUNITY)
Admission: RE | Admit: 2024-02-12 | Discharge: 2024-02-12 | Disposition: A | Discharge status: Home / Self Care | Source: Ambulatory Visit | Attending: Obstetrics & Gynecology | Admitting: Obstetrics & Gynecology

## 2024-02-12 DIAGNOSIS — Z113 Encounter for screening for infections with a predominantly sexual mode of transmission: Secondary | ICD-10-CM | POA: Diagnosis present

## 2024-02-12 NOTE — Progress Notes (Signed)
   NURSE VISIT- VAGINITIS/STD/POC  SUBJECTIVE:  Kaitlyn Meyer is a 23 y.o. G1P1001 GYN patientfemale here for a vaginal swab for STD screen.  She reports the following symptoms: none for 0 days. Denies abnormal vaginal bleeding, significant pelvic pain, fever, or UTI symptoms.  OBJECTIVE:  There were no vitals taken for this visit.  Appears well, in no apparent distress  ASSESSMENT: Vaginal swab for STD screen  PLAN: Self-collected vaginal probe for Gonorrhea, Chlamydia, Trichomonas, Bacterial Vaginosis, Yeast sent to lab Treatment: to be determined once results are received Follow-up as needed if symptoms persist/worsen, or new symptoms develop  Kerrie Peek  02/12/2024 2:37 PM

## 2024-02-13 LAB — HIV ANTIBODY (ROUTINE TESTING W REFLEX): HIV Screen 4th Generation wRfx: NONREACTIVE

## 2024-02-13 LAB — HEPATITIS B SURFACE ANTIGEN: Hepatitis B Surface Ag: NEGATIVE

## 2024-02-13 LAB — RPR: RPR Ser Ql: NONREACTIVE

## 2024-02-14 ENCOUNTER — Encounter: Payer: Self-pay | Admitting: Women's Health

## 2024-02-14 LAB — CERVICOVAGINAL ANCILLARY ONLY
Chlamydia: NEGATIVE
Comment: NEGATIVE
Comment: NORMAL
Neisseria Gonorrhea: NEGATIVE

## 2024-03-03 ENCOUNTER — Ambulatory Visit
Admission: RE | Admit: 2024-03-03 | Discharge: 2024-03-03 | Disposition: A | Discharge status: Home / Self Care | Source: Ambulatory Visit | Attending: Nurse Practitioner | Admitting: Nurse Practitioner

## 2024-03-03 VITALS — BP 111/80 | HR 108 | Temp 101.2°F | Resp 16

## 2024-03-03 DIAGNOSIS — H6692 Otitis media, unspecified, left ear: Secondary | ICD-10-CM | POA: Diagnosis not present

## 2024-03-03 DIAGNOSIS — J069 Acute upper respiratory infection, unspecified: Secondary | ICD-10-CM

## 2024-03-03 LAB — POC COVID19/FLU A&B COMBO
Covid Antigen, POC: NEGATIVE
Influenza A Antigen, POC: NEGATIVE
Influenza B Antigen, POC: NEGATIVE

## 2024-03-03 LAB — POCT RAPID STREP A (OFFICE): Rapid Strep A Screen: NEGATIVE

## 2024-03-03 MED ORDER — ACETAMINOPHEN 325 MG PO TABS
650.0000 mg | ORAL_TABLET | Freq: Once | ORAL | Status: AC
  Administered 2024-03-03: 650 mg via ORAL

## 2024-03-03 MED ORDER — AMOXICILLIN 875 MG PO TABS: 875.0000 mg | ORAL_TABLET | Freq: Two times a day (BID) | ORAL | 0 refills | Status: AC

## 2024-03-03 NOTE — Discharge Instructions (Signed)
 You have an ear infection in your left ear.  Take the amoxicillin twice daily for 7 days as prescribed to treat it.  Your other symptoms are consistent with a viral illness.  The COVID-19 and influenza testing is negative today along with the rapid strep throat test.  Symptoms should improve over the next few days.  If you develop chest pain or shortness of breath, please be seen in the emergency room.  Some things that can make you feel better are: - Increased rest - Increasing fluid with water /sugar free electrolytes - Acetaminophen  and ibuprofen  as needed for fever/pain - Salt water  gargling, chloraseptic spray and throat lozenges - OTC guaifenesin  (Mucinex ) 600 mg twice daily for congestion - Saline sinus flushes or a neti pot - Humidifying the air

## 2024-03-03 NOTE — ED Triage Notes (Signed)
 Fever, sore throat, right ear pain, body aches, chills, congestion, cough, headache x 2 days. Taking Nyquil.

## 2024-03-03 NOTE — ED Provider Notes (Signed)
 [facility]    CSN: [id number] Arrival date & time: 03/03/24  1031      History   Chief Complaint Chief Complaint  Patient presents with   Fever    Just haven't felt good in days - Entered by patient   Sore Throat    HPI Kaitlyn Meyer is a 23 y.o. female.   Patient presents today with 2-day history of fever, body aches and chills, slightly congested cough, shortness of breath, stuffy nose, sore throat, headache, right ear pain, nausea whenever she tries to eat anything, decreased appetite, and fatigue.  She denies chest pain or wheezing, chest congestion, abdominal pain, vomiting, and diarrhea.  No known sick contacts.  Has been taking DayQuil/NyQuil for symptoms with minimal temporary improvement.  She has a toddler at home who does not go to daycare and is not currently sick.    Past Medical History:  Diagnosis Date   Anxiety    Depression    currently doing fine (11/23)   PID (pelvic inflammatory disease)    UTI (urinary tract infection)     Patient Active Problem List   Diagnosis Date Noted   Vaginal delivery 09/15/2022   Indication for care in labor or delivery 09/13/2022   Decreased fetal movement 09/13/2022   Vitamin D deficiency 06/28/2021   Family history of breast cancer 06/27/2021    Past Surgical History:  Procedure Laterality Date   NO PAST SURGERIES      OB History     Gravida  1   Para  1   Term  1   Preterm  0   AB  0   Living  1      SAB  0   IAB  0   Ectopic  0   Multiple  0   Live Births  1            Home Medications    Prior to Admission medications   Medication Sig Start Date End Date Taking? Authorizing Provider  amoxicillin (AMOXIL) 875 MG tablet Take 1 tablet (875 mg total) by mouth 2 (two) times daily for 7 days. 03/03/24 03/10/24 Yes Wilhemena Harbour, NP  acetaminophen  (TYLENOL ) 325 MG tablet Take 2 tablets (650 mg total) by mouth every 4 (four) hours as needed (for pain scale <  4). Patient not taking: Reported on 10/19/2022 09/17/22   Autry-Lott, Jeneane Miracle, DO  ibuprofen  (ADVIL ) 600 MG tablet Take 1 tablet (600 mg total) by mouth every 6 (six) hours. Patient not taking: Reported on 10/19/2022 09/17/22   Autry-Lott, Jeneane Miracle, DO  Prenatal Vit-Fe Fumarate-FA (PRENATAL VITAMIN PO) Take by mouth. Patient not taking: Reported on 10/19/2022    [provider]  fluticasone  (FLONASE ) 50 MCG/ACT nasal spray Place 2 sprays into both nostrils daily. 09/12/19 01/03/20  Ethlyn Herd, MD  traZODone (DESYREL) 50 MG tablet TAKE 1 2 TABLET 2 TIMES DAILY AS NEEDED FOR ANXIETY AND 1 2 BY MOUTH AT BEDTIME FOR INSOMNIA AS NEEDED 09/09/19 01/03/20  [provider]    Family History Family History  Problem Relation Age of Onset   Cancer Mother        breast   Heart disease Mother        "partial heart failure"   Diabetes Mother    Hypertension Mother    Hyperlipidemia Mother    Drug abuse Father    Alcohol abuse Father    Diabetes Father    Bipolar disorder Father  Social History Social History   Tobacco Use   Smoking status: Former    Current packs/day: 0.25    Types: Cigarettes   Smokeless tobacco: Never  Vaping Use   Vaping status: Never Used  Substance Use Topics   Alcohol use: No    Comment: rare   Drug use: No     Allergies   Patient has no known allergies.   Review of Systems Review of Systems Per HPI  Physical Exam Triage Vital Signs ED Triage Vitals  Encounter Vitals Group     BP 03/03/24 1043 111/80     Systolic BP Percentile --      Diastolic BP Percentile --      Pulse Rate 03/03/24 1043 (!) 108     Resp 03/03/24 1043 16     Temp 03/03/24 1043 (!) 101.2 F (38.4 C)     Temp Source 03/03/24 1043 Oral     SpO2 03/03/24 1043 97 %     Weight --      Height --      Head Circumference --      Peak Flow --      Pain Score 03/03/24 1044 6     Pain Loc --      Pain Education --      Exclude from Growth Chart --    No  data found.  Updated Vital Signs BP 111/80 (BP Location: Right Arm)   Pulse (!) 108   Temp (!) 101.2 F (38.4 C) (Oral)   Resp 16   LMP 02/12/2024 (Approximate)   SpO2 97%   Breastfeeding No   Visual Acuity Right Eye Distance:   Left Eye Distance:   Bilateral Distance:    Right Eye Near:   Left Eye Near:    Bilateral Near:     Physical Exam Vitals and nursing note reviewed.  Constitutional:      General: She is not in acute distress.    Appearance: Normal appearance. She is not ill-appearing or toxic-appearing.  HENT:     Head: Normocephalic and atraumatic.     Right Ear: Ear canal and external ear normal. No drainage, swelling or tenderness. A middle ear effusion is present. Tympanic membrane is not erythematous.     Left Ear: Ear canal and external ear normal. No drainage, swelling or tenderness. A middle ear effusion is present. Tympanic membrane is erythematous.     Nose: No congestion or rhinorrhea.     Mouth/Throat:     Mouth: Mucous membranes are moist.     Pharynx: Oropharynx is clear. Posterior oropharyngeal erythema present. No oropharyngeal exudate.     Tonsils: No tonsillar exudate.  Eyes:     General: No scleral icterus.    Extraocular Movements: Extraocular movements intact.  Cardiovascular:     Rate and Rhythm: Regular rhythm. Tachycardia present.  Pulmonary:     Effort: Pulmonary effort is normal. No respiratory distress.     Breath sounds: Normal breath sounds. No wheezing, rhonchi or rales.  Musculoskeletal:     Cervical back: Normal range of motion and neck supple.  Lymphadenopathy:     Cervical: No cervical adenopathy.  Skin:    General: Skin is warm and dry.     Coloration: Skin is not jaundiced or pale.     Findings: No erythema or rash.  Neurological:     Mental Status: She is alert and oriented to person, place, and time.  Psychiatric:  Behavior: Behavior is cooperative.      UC Treatments / Results  Labs (all labs ordered are  listed, but only abnormal results are displayed) Labs Reviewed  POCT RAPID STREP A (OFFICE) - Normal  POC COVID19/FLU A&B COMBO - Normal    EKG   Radiology No results found.  Procedures Procedures (including critical care time)  Medications Ordered in UC Medications  acetaminophen  (TYLENOL ) tablet 650 mg (650 mg Oral Given 03/03/24 1055)    Initial Impression / Assessment and Plan / UC Course  I have reviewed the triage vital signs and the nursing notes.  Pertinent labs & imaging results that were available during my care of the patient were reviewed by me and considered in my medical decision making (see chart for details).   Patient is febrile and tachycardic in triage, otherwise vital signs are stable.  1. Viral URI with cough Suspect viral etiology COVID-19, influenza testing is negative as well as rapid strep throat test Supportive care discussed ER and return precautions discussed Work excuse provided  2. Acute otitis media, left Treat with amoxicillin twice daily for 7 days Work excuse provided  The patient was given the opportunity to ask questions.  All questions answered to their satisfaction.  The patient is in agreement to this plan.    Final Clinical Impressions(s) / UC Diagnoses   Final diagnoses:  Viral URI with cough  Acute otitis media, left     Discharge Instructions      You have an ear infection in your left ear.  Take the amoxicillin twice daily for 7 days as prescribed to treat it.  Your other symptoms are consistent with a viral illness.  The COVID-19 and influenza testing is negative today along with the rapid strep throat test.  Symptoms should improve over the next few days.  If you develop chest pain or shortness of breath, please be seen in the emergency room.  Some things that can make you feel better are: - Increased rest - Increasing fluid with water /sugar free electrolytes - Acetaminophen  and ibuprofen  as needed for fever/pain -  Salt water  gargling, chloraseptic spray and throat lozenges - OTC guaifenesin  (Mucinex ) 600 mg twice daily for congestion - Saline sinus flushes or a neti pot - Humidifying the air     ED Prescriptions     Medication Sig Dispense Auth. Provider   amoxicillin (AMOXIL) 875 MG tablet Take 1 tablet (875 mg total) by mouth 2 (two) times daily for 7 days. 14 tablet Wilhemena Harbour, NP      PDMP not reviewed this encounter.   Wilhemena Harbour, NP 03/03/24 1210

## 2024-08-06 ENCOUNTER — Ambulatory Visit
Admission: EM | Admit: 2024-08-06 | Discharge: 2024-08-06 | Disposition: A | Discharge status: Home / Self Care | Attending: Family Medicine | Admitting: Family Medicine

## 2024-08-06 DIAGNOSIS — Z113 Encounter for screening for infections with a predominantly sexual mode of transmission: Secondary | ICD-10-CM | POA: Insufficient documentation

## 2024-08-06 DIAGNOSIS — Z202 Contact with and (suspected) exposure to infections with a predominantly sexual mode of transmission: Secondary | ICD-10-CM | POA: Insufficient documentation

## 2024-08-06 MED ORDER — DOXYCYCLINE HYCLATE 100 MG PO CAPS: 100.0000 mg | ORAL_CAPSULE | Freq: Two times a day (BID) | ORAL | 0 refills | Status: DC

## 2024-08-06 MED ORDER — CEFTRIAXONE SODIUM 500 MG IJ SOLR
500.0000 mg | Freq: Once | INTRAMUSCULAR | Status: AC
  Administered 2024-08-06: 500 mg via INTRAMUSCULAR

## 2024-08-06 NOTE — ED Triage Notes (Signed)
 Pt reports needing STD testing, was notified by partner has tested positive for Chlamydia today. Pt has been having unportected sex and has requested to have all screening done.

## 2024-08-06 NOTE — ED Provider Notes (Signed)
 [facility]   [id number] 08/06/24 Arrival Time: 1405  ASSESSMENT & PLAN:  1. Screening for STDs (sexually transmitted diseases)   2. Exposure to STD    Meds ordered this encounter  Medications   cefTRIAXone  (ROCEPHIN ) injection 500 mg    Antibiotic Indication::   STD   doxycycline  (VIBRAMYCIN ) 100 MG capsule    Sig: Take 1 capsule (100 mg total) by mouth 2 (two) times daily.    Dispense:  14 capsule    Refill:  0      Discharge Instructions      You have been given the following today for treatment of suspected gonorrhea and/or chlamydia:  cefTRIAXone  (ROCEPHIN ) injection 500 mg  Please pick up your prescription for doxycycline  100 mg and begin taking twice daily for the next seven (7) days.  Even though we have treated you today, we have sent testing for sexually transmitted infections. We will notify you of any positive results once they are received. If required, we will prescribe any medications you might need.  Please refrain from all sexual activity for at least the next seven days.     With s/s of PID.  Labs Reviewed  HIV ANTIBODY (ROUTINE TESTING W REFLEX)  RPR  CERVICOVAGINAL ANCILLARY ONLY  CYTOLOGY, (ORAL, ANAL, URETHRAL) ANCILLARY ONLY    Will notify of any positive results. Instructed to refrain from sexual activity for at least seven days.  Reviewed expectations re: course of current medical issues. Questions answered. Outlined signs and symptoms indicating need for more acute intervention. Patient verbalized understanding. After Visit Summary given.   SUBJECTIVE:  Kaitlyn Meyer is a 23 y.o. female who presents with complaint of exposure to chlamydia; recent partner tested +. She denies symptoms. Requests testing/empiric tx.  Patient's last menstrual period was 08/05/2024 (exact date).   OBJECTIVE:  Vitals:   08/06/24 1412  BP: (!) 141/82  Pulse: 78  Resp: 20  Temp: 98.6 F (37 C)  TempSrc: Oral  SpO2: 97%     General appearance: alert, cooperative, appears stated age and no distress Lungs: unlabored respirations; speaks full sentences without difficulty Back: no CVA tenderness; FROM at waist Abdomen: soft, non-tender GU: deferred Skin: warm and dry Psychological: alert and cooperative; normal mood and affect.    Labs Reviewed  HIV ANTIBODY (ROUTINE TESTING W REFLEX)  RPR  CERVICOVAGINAL ANCILLARY ONLY  CYTOLOGY, (ORAL, ANAL, URETHRAL) ANCILLARY ONLY    No Known Allergies  Past Medical History:  Diagnosis Date   Anxiety    Depression    currently doing fine (11/23)   PID (pelvic inflammatory disease)    UTI (urinary tract infection)    Family History  Problem Relation Age of Onset   Cancer Mother        breast   Heart disease Mother        partial heart failure   Diabetes Mother    Hypertension Mother    Hyperlipidemia Mother    Drug abuse Father    Alcohol abuse Father    Diabetes Father    Bipolar disorder Father    Social History   Socioeconomic History   Marital status: Single    Spouse name: Not on file   Number of children: Not on file   Years of education: Not on file   Highest education level: Not on file  Occupational History   Not on file  Tobacco Use   Smoking status: Former    Current packs/day: 0.25    Types:  Cigarettes   Smokeless tobacco: Never  Vaping Use   Vaping status: Never Used  Substance and Sexual Activity   Alcohol use: No    Comment: rare   Drug use: No   Sexual activity: Not Currently    Birth control/protection: None  Other Topics Concern   Not on file  Social History Narrative   Not on file   Social Drivers of Health   Financial Resource Strain: Low Risk  (03/02/2022)   Overall Financial Resource Strain (CARDIA)    Difficulty of Paying Living Expenses: Not hard at all  Food Insecurity: No Food Insecurity (09/13/2022)   Hunger Vital Sign    Worried About Running Out of Food in the Last Year: Never true    Ran Out of  Food in the Last Year: Never true  Transportation Needs: No Transportation Needs (09/13/2022)   PRAPARE - Administrator, Civil Service (Medical): No    Lack of Transportation (Non-Medical): No  Physical Activity: Inactive (03/02/2022)   Exercise Vital Sign    Days of Exercise per Week: 0 days    Minutes of Exercise per Session: 0 min  Stress: No Stress Concern Present (03/02/2022)   [employer] of Occupational Health - Occupational Stress Questionnaire    Feeling of Stress : Not at all  Social Connections: Socially Isolated (03/02/2022)   Social Connection and Isolation Panel    Frequency of Communication with Friends and Family: More than three times a week    Frequency of Social Gatherings with Friends and Family: Twice a week    Attends Religious Services: Never    Database administrator or Organizations: No    Attends Banker Meetings: Never    Marital Status: Never married  Intimate Partner Violence: Not At Risk (09/13/2022)   Humiliation, Afraid, Rape, and Kick questionnaire    Fear of Current or Ex-Partner: No    Emotionally Abused: No    Physically Abused: No    Sexually Abused: No           Rolinda Rogue, MD 08/06/24 [phone]

## 2024-08-06 NOTE — Discharge Instructions (Addendum)

## 2024-08-07 LAB — CERVICOVAGINAL ANCILLARY ONLY
Chlamydia: POSITIVE — AB
Comment: NEGATIVE
Comment: NEGATIVE
Comment: NORMAL
Neisseria Gonorrhea: NEGATIVE
Trichomonas: NEGATIVE

## 2024-08-07 LAB — RPR: RPR Ser Ql: NONREACTIVE

## 2024-08-07 LAB — HIV ANTIBODY (ROUTINE TESTING W REFLEX): HIV Screen 4th Generation wRfx: NONREACTIVE

## 2024-08-07 LAB — CYTOLOGY, (ORAL, ANAL, URETHRAL) ANCILLARY ONLY
Chlamydia: NEGATIVE
Comment: NEGATIVE
Comment: NORMAL
Neisseria Gonorrhea: NEGATIVE

## 2024-08-08 ENCOUNTER — Ambulatory Visit (HOSPITAL_COMMUNITY): Payer: Self-pay

## 2024-11-21 ENCOUNTER — Ambulatory Visit

## 2024-11-21 VITALS — BP 105/68 | HR 66 | Temp 98.1°F | Wt 154.0 lb

## 2024-11-21 DIAGNOSIS — R5383 Other fatigue: Secondary | ICD-10-CM

## 2024-11-21 DIAGNOSIS — F900 Attention-deficit hyperactivity disorder, predominantly inattentive type: Secondary | ICD-10-CM | POA: Diagnosis not present

## 2024-11-21 DIAGNOSIS — Z130 Encounter for screening for diseases of the blood and blood-forming organs and certain disorders involving the immune mechanism: Secondary | ICD-10-CM | POA: Diagnosis not present

## 2024-11-21 DIAGNOSIS — R42 Dizziness and giddiness: Secondary | ICD-10-CM | POA: Diagnosis not present

## 2024-11-21 DIAGNOSIS — Z13220 Encounter for screening for lipoid disorders: Secondary | ICD-10-CM

## 2024-11-21 MED ORDER — AMPHETAMINE-DEXTROAMPHET ER 20 MG PO CP24: 20.0000 mg | ORAL_CAPSULE | ORAL | 0 refills | Status: AC

## 2024-11-21 NOTE — Progress Notes (Signed)
 "  New Patient Office Visit  Subjective    Patient ID: Kaitlyn Meyer, female    DOB: [birth year]  Age: 24 y.o. MRN: [id number]  HPI Kaitlyn Meyer presents to establish care. Currently follows up with gynecology. Is due for a pap smear, and needs to make an appointment.   Discussed the use of AI scribe software for clinical note transcription with the patient, who gave verbal consent to proceed.  History of Present Illness Kaitlyn Meyer is a 24 year old female who presents with symptoms of ADHD and dizziness.  She experiences dizziness, particularly when standing up quickly or after long periods of standing, and fatigue. She has a history of anemia diagnosed post-pregnancy and has been taking iron supplements combined with vitamin C for a few months. She also reports nausea, which she attributes to dietary factors. She has been consistently working out and trying to diet, which she describes as a 'slippery slope' due to the need to balance eating and weight loss.  She describes a history of attention difficulties, which she attributes to ADHD. She was evaluated by a psychiatrist during high school and was treated for ADHD at that time. She stopped taking medications after high school, believing she could manage without them. However, returning to school has resurfaced her difficulties with focus and retention, stating 'I just cannot retain anything'. She passed her first semester with a B and a C but feels she is not doing well mentally. She describes a history of struggling in school, experiencing burnout, and dropping out of college due to these challenges. 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  She notes that her anxiety and ADHD symptoms overlap, particularly in terms of focus and retention.  States that she is more anxious due to her increased ADHD symptoms, and her not being able to  concentrate. She works long hours from 4 PM to 11 PM and is concerned about the impact of her symptoms on her job performance. She is a single mother and is seeking a career that provides financial stability.   Past Medical History:  Diagnosis Date   Anxiety    Depression    currently doing fine (11/23)   PID (pelvic inflammatory disease)    UTI (urinary tract infection)     Past Surgical History:  Procedure Laterality Date   NO PAST SURGERIES      Family History  Problem Relation Age of Onset   Cancer Mother        breast   Heart disease Mother        partial heart failure   Diabetes Mother    Hypertension Mother    Hyperlipidemia Mother    Drug abuse Father    Alcohol abuse Father    Diabetes Father    Bipolar disorder Father     Social History   Socioeconomic History   Marital status: Single    Spouse name: Not on file   Number of children: Not on file   Years of education: Not on file   Highest education level: Associate degree: occupational, scientist, product/process development, or vocational program  Occupational History   Not on file  Tobacco Use   Smoking status: Former    Current packs/day: 0.25    Types: Cigarettes   Smokeless tobacco: Never  Vaping Use   Vaping status: Never Used  Substance and Sexual Activity   Alcohol use: No  Comment: rare   Drug use: No   Sexual activity: Not Currently    Birth control/protection: None  Other Topics Concern   Not on file  Social History Narrative   Not on file   Social Drivers of Health   Tobacco Use: Medium Risk (11/21/2024)   Patient History    Smoking Tobacco Use: Former    Smokeless Tobacco Use: Never    Passive Exposure: Not on file  Financial Resource Strain: Medium Risk (11/21/2024)   Overall Financial Resource Strain (CARDIA)    Difficulty of Paying Living Expenses: Somewhat hard  Food Insecurity: No Food Insecurity (11/21/2024)   Epic    Worried About Radiation Protection Practitioner of Food in the Last Year: Never true    Ran Out of  Food in the Last Year: Never true  Transportation Needs: No Transportation Needs (11/21/2024)   Epic    Lack of Transportation (Medical): No    Lack of Transportation (Non-Medical): No  Physical Activity: Sufficiently Active (11/21/2024)   Exercise Vital Sign    Days of Exercise per Week: 5 days    Minutes of Exercise per Session: 70 min  Stress: Stress Concern Present (11/21/2024)   [employer] of Occupational Health - Occupational Stress Questionnaire    Feeling of Stress: Very much  Social Connections: Socially Isolated (11/21/2024)   Social Connection and Isolation Panel    Frequency of Communication with Friends and Family: More than three times a week    Frequency of Social Gatherings with Friends and Family: Once a week    Attends Religious Services: Never    Database Administrator or Organizations: No    Attends Engineer, Structural: Not on file    Marital Status: Never married  Intimate Partner Violence: Not At Risk (09/13/2022)   Humiliation, Afraid, Rape, and Kick questionnaire    Fear of Current or Ex-Partner: No    Emotionally Abused: No    Physically Abused: No    Sexually Abused: No  Depression (PHQ2-9): Medium Risk (08/16/2022)   Depression (PHQ2-9)    PHQ-2 Score: 5  Alcohol Screen: High Risk (11/21/2024)   Alcohol Screen    Last Alcohol Screening Score (AUDIT): 17  Housing: High Risk (11/21/2024)   Epic    Unable to Pay for Housing in the Last Year: Yes    Number of Times Moved in the Last Year: 0    Homeless in the Last Year: No  Utilities: Not At Risk (09/13/2022)   [facility] Utilities    Threatened with loss of utilities: No  Health Literacy: Not on file   Review of Systems  Constitutional:  Positive for fatigue. Negative for activity change, appetite change, chills and unexpected weight change.  Respiratory:  Negative for cough, chest tightness, shortness of breath and wheezing.   Cardiovascular:  Negative for chest pain and palpitations.   Neurological:  Positive for dizziness and light-headedness. Negative for weakness and headaches.  Hematological:  Does not bruise/bleed easily.  Psychiatric/Behavioral:  Positive for decreased concentration. Negative for agitation, sleep disturbance and suicidal ideas. The patient is nervous/anxious.    Objective    Today's Vitals   11/21/24 0815  BP: 105/68  Pulse: 66  Temp: 98.1 F (36.7 C)  SpO2: 98%  Weight: 154 lb (69.9 kg)   Body mass index is 29.1 kg/m.   Physical Exam Vitals and nursing note reviewed.  Constitutional:      General: She is not in acute distress.    Appearance: Normal appearance.  She is not ill-appearing.  Cardiovascular:     Rate and Rhythm: Normal rate and regular rhythm.     Heart sounds: Normal heart sounds, S1 normal and S2 normal. No murmur heard. Pulmonary:     Effort: Pulmonary effort is normal. No respiratory distress.     Breath sounds: Normal breath sounds. No wheezing.  Skin:    Coloration: Skin is not pale.  Neurological:     Mental Status: She is alert.  Psychiatric:        Mood and Affect: Mood normal.        Behavior: Behavior normal.        Thought Content: Thought content normal.        Judgment: Judgment normal.       11/21/2024    8:37 AM 08/16/2022   10:53 AM 03/02/2022    9:38 AM 06/27/2021    1:41 PM 03/24/2021   12:03 PM  Depression screen PHQ 2/9  Decreased Interest 2 1 0 3 2  Down, Depressed, Hopeless 2 0 0 1 2  PHQ - 2 Score 4 1 0 4 4  Altered sleeping 2 2 2 3 2   Tired, decreased energy 2 2 2 3 2   Change in appetite 2 0 2 3 2   Feeling bad or failure about yourself  2 0 0 1 2  Trouble concentrating 3 0 0 3 2  Moving slowly or fidgety/restless 1 0 0 2 2  Suicidal thoughts 0 0 0 0 0  PHQ-9 Score 16 5  6  19  16    Difficult doing work/chores Somewhat difficult         Data saved with a previous flowsheet row definition       11/21/2024    8:37 AM 08/16/2022   10:53 AM 03/02/2022    9:38 AM 06/27/2021    1:42 PM   GAD 7 : Generalized Anxiety Score  Nervous, Anxious, on Edge 1 1  0  3   Control/stop worrying 1 0  0  3   Worry too much - different things 1 1  0  3   Trouble relaxing 1 0  0  3   Restless 1 1  0  2   Easily annoyed or irritable 1 1  0  3   Afraid - awful might happen 1 0  0  3   Total GAD 7 Score 7 4 0 20  Anxiety Difficulty Somewhat difficult        Data saved with a previous flowsheet row definition   - Adult Vanderbilt waited at today's visit. Positive for part A and part B.  Scanned in chart.  Assessment & Plan:  1. Attention deficit hyperactivity disorder (ADHD), predominantly inattentive type (Primary) - ADHD with primary focus issues, exacerbated by school. Anxiety and depression scores elevated.  Advised patient that symptoms of ADHD and anxiety can overlap at times.  Patient states that ADHD causes her anxiety.  Extended release medication preferred for workday coverage. - Prescribed Adderall extended release for 8-hour coverage. - Scheduled follow-up in one month to assess medication efficacy and side effects. - Advised monitoring of sleep, appetite, and weight to prevent adverse effects.  2. Dizziness - Post-pregnancy anemia with dizziness, fatigue, and nausea. Symptoms worsened after stopping postnatal supplements. Iron supplements and vitamin C used. - Ordered iron panel to assess current anemia status. - Comprehensive metabolic panel with GFR - CBC with Differential/Platelet - Iron, TIBC and Ferritin Panel  3. Screening for  deficiency anemia - CBC with Differential/Platelet - Iron, TIBC and Ferritin Panel  4. Screening for lipid disorders - Ordered basic labs including cholesterol, kidney function, and liver function. - Advised follow-up with gynecology for Pap smear. - Lipid panel  5. Fatigue, unspecified type - TSH - T4, free   Meds ordered this encounter  Medications   amphetamine-dextroamphetamine (ADDERALL XR) 20 MG 24 hr capsule    Sig: Take 1  capsule (20 mg total) by mouth every morning.    Dispense:  30 capsule    Refill:  0    Return in about 4 weeks (around 12/19/2024).   Damien KATHEE Pringle, FNP  Note:  This document was prepared using Dragon voice recognition software and may include unintentional dictation errors.   "

## 2024-11-25 ENCOUNTER — Encounter: Admitting: Family Medicine

## 2024-12-19 ENCOUNTER — Ambulatory Visit
